# Patient Record
Sex: Male | Born: 1980
Health system: Southern US, Community
[De-identification: ages and names within clinical notes are randomized; demographics above are authoritative.]

## PROBLEM LIST (undated history)

## (undated) DIAGNOSIS — E669 Obesity, unspecified: Secondary | ICD-10-CM

## (undated) DIAGNOSIS — Z7289 Other problems related to lifestyle: Secondary | ICD-10-CM

## (undated) DIAGNOSIS — R51 Headache: Secondary | ICD-10-CM

## (undated) DIAGNOSIS — Z8249 Family history of ischemic heart disease and other diseases of the circulatory system: Secondary | ICD-10-CM

## (undated) DIAGNOSIS — K219 Gastro-esophageal reflux disease without esophagitis: Secondary | ICD-10-CM

## (undated) DIAGNOSIS — Z789 Other specified health status: Secondary | ICD-10-CM

## (undated) HISTORY — DX: Headache: R51

## (undated) HISTORY — DX: Obesity, unspecified: E66.9

## (undated) HISTORY — DX: Other specified health status: Z78.9

## (undated) HISTORY — DX: Other problems related to lifestyle: Z72.89

## (undated) HISTORY — DX: Family history of ischemic heart disease and other diseases of the circulatory system: Z82.49

## (undated) HISTORY — DX: Gastro-esophageal reflux disease without esophagitis: K21.9

---
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
## 2015-01-29 DIAGNOSIS — K219 Gastro-esophageal reflux disease without esophagitis: Secondary | ICD-10-CM

## 2015-01-29 HISTORY — DX: Gastro-esophageal reflux disease without esophagitis: K21.9

## 2015-02-04 ENCOUNTER — Encounter: Payer: Self-pay | Admitting: Medical

## 2015-02-04 ENCOUNTER — Ambulatory Visit (INDEPENDENT_AMBULATORY_CARE_PROVIDER_SITE_OTHER): Payer: BLUE CROSS/BLUE SHIELD | Admitting: Medical

## 2015-02-04 VITALS — BP 122/90 | HR 90 | Resp 16 | Ht 70.0 in | Wt 221.6 lb

## 2015-02-04 DIAGNOSIS — Z8249 Family history of ischemic heart disease and other diseases of the circulatory system: Secondary | ICD-10-CM

## 2015-02-04 DIAGNOSIS — R1310 Dysphagia, unspecified: Secondary | ICD-10-CM

## 2015-02-04 DIAGNOSIS — R0602 Shortness of breath: Secondary | ICD-10-CM | POA: Diagnosis not present

## 2015-02-04 DIAGNOSIS — R079 Chest pain, unspecified: Secondary | ICD-10-CM | POA: Diagnosis not present

## 2015-02-04 MED ORDER — DEXLANSOPRAZOLE 60 MG PO CPDR: 60.0000 mg | DELAYED_RELEASE_CAPSULE | Freq: Every day | ORAL | Status: DC

## 2015-02-04 NOTE — Progress Notes (Signed)
Subjective: Chief Complaint  Patient presents with  . throat issues    feels like something is stuck usually only when eating, but will also happen just when swollowing saliva. been going on a couple of months but getting worse. he says he feels it in his throat down to his chest and lungs. "burning sensation" also gets dizzy and sometimes headaches  . also    been having pains around the breast area on both sides, more so left around heart. can feel tingling in hands and left foot.   Here as a new patient.  He reports problems with swallowing. First noted problems 3 months ago.   Was eating, felt like food went in the wrong tube.  This started becoming more frequent.   Feels like something is stuck in his throat.  When drinking soda, gets immediate hiccups sometimes, sometimes discomfort in upper chest/esophagus.   Water is fine.   He notes with mashed potatoes, felt like he had mashed potatoes in his lungs.   Certainly has a lot of problems with solids and meats.  He has not had immediate vomiting with any food.   Lately has had some problems to the point of regurgitating or vomiting up food.  Gets a burning sensation in upper chest.  At times vision seems blurry, will get dizzy.  Sometimes gets shortness of breath.    He does note some recently chest pains maybe once weekly the last few weeks.  Chest pain can last minutes.   Sometimes mid chest sometimes left lower chest.  Pain in chest is sharp, denies associated nausea, sweating, vomiting, but no jaw or arm pain.  Sometimes gets tingling in left hand or left foot.  Sometimes feels SOB.  He is a nonsmoker.  Drinks 3-4 beers daily, typically coors light.  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    Does eat some acidic and spicy foods.   Uses no particular diet discretion.    Mom had MI at age 34 yo.   Dad had stroke age in his early 73s.  He notes no routine health care in 13 years.    ROS as in subjective   Objective: BP 122/90 mmHg   Pulse 90  Resp 16  Ht 5\' 10"  (1.778 m)  Wt 221 lb 9.6 oz (100.517 kg)  BMI 31.80 kg/m2  General appearance: alert, no distress, WD/WN, obese white male HEENT: normocephalic, sclerae anicteric, TMs pearly, nares patent, no discharge or erythema, pharynx normal Oral cavity: MMM, no lesions Neck: supple, no lymphadenopathy, no thyromegaly, no masses Heart: RRR, normal S1, S2, no murmurs Lungs: CTA bilaterally, no wheezes, rhonchi, or rales Abdomen: +bs, soft, non tender, non distended, no masses, no hepatomegaly, no splenomegaly Pulses: 2+ symmetric, upper and lower extremities, normal cap refill Ext: no edema Neuro: CN2-12 intact, nonfocal exam   Adult ECG Report  Indication: chest pain  Rate: 87 bpm  Rhythm: normal sinus rhythm  QRS Axis: 44 degrees  PR Interval: 169ms  QRS Duration: 161ms  QTc: 434ms  Conduction Disturbances: none  Other Abnormalities: none  Patient's cardiac risk factors are: family history of premature cardiovascular disease and obesity (BMI >= 30 kg/m2).  EKG comparison: none  Narrative Interpretation: normal EKG    Assessment: Encounter Diagnoses  Name Primary?  . Trouble swallowing Yes  . Chest pain, unspecified chest pain type   . SOB (shortness of breath)   . Family history of premature CAD      Plan: Discussed his swallowing  symptoms that may or may not be related to his chest pain and SOB.   discussed differential.   EKG done today.  Discussed his concerns.  He is already using slower eating and using smaller pieces of food.  Advised he cut significantly down on alcohol since he is currently drinking 3-4 beers daily.  Discussed avoidance of GERD trigger foods.   Begin trial of Dexilant daily, samples given.  Recheck in 2-3 weeks for a physical.   Discussed possible GI referral if not resolving.    Discussed premature CAD, risks factors for heart disease.   Advised he return in 2-3 weeks for fasting labs, physical.

## 2015-02-06 ENCOUNTER — Ambulatory Visit: Payer: Self-pay | Admitting: Medical

## 2015-02-25 ENCOUNTER — Encounter: Payer: Self-pay | Admitting: Medical

## 2015-02-25 ENCOUNTER — Ambulatory Visit
Admission: RE | Admit: 2015-02-25 | Discharge: 2015-02-25 | Disposition: A | Discharge status: Home / Self Care | Payer: BLUE CROSS/BLUE SHIELD | Source: Ambulatory Visit | Attending: Medical | Admitting: Medical

## 2015-02-25 ENCOUNTER — Ambulatory Visit (INDEPENDENT_AMBULATORY_CARE_PROVIDER_SITE_OTHER): Payer: BLUE CROSS/BLUE SHIELD | Admitting: Medical

## 2015-02-25 VITALS — BP 130/92 | HR 94 | Ht 71.0 in | Wt 222.0 lb

## 2015-02-25 DIAGNOSIS — K2100 Gastro-esophageal reflux disease with esophagitis, without bleeding: Secondary | ICD-10-CM | POA: Insufficient documentation

## 2015-02-25 DIAGNOSIS — R1310 Dysphagia, unspecified: Secondary | ICD-10-CM

## 2015-02-25 DIAGNOSIS — R079 Chest pain, unspecified: Secondary | ICD-10-CM

## 2015-02-25 DIAGNOSIS — Z0000 Encounter for general adult medical examination without abnormal findings: Secondary | ICD-10-CM | POA: Insufficient documentation

## 2015-02-25 DIAGNOSIS — Z87891 Personal history of nicotine dependence: Secondary | ICD-10-CM | POA: Diagnosis not present

## 2015-02-25 DIAGNOSIS — E669 Obesity, unspecified: Secondary | ICD-10-CM

## 2015-02-25 DIAGNOSIS — Z23 Encounter for immunization: Secondary | ICD-10-CM

## 2015-02-25 LAB — LIPID PANEL
CHOL/HDL RATIO: 3.6 ratio (ref <=5.0)
Cholesterol: 209 mg/dL — ABNORMAL HIGH (ref 125–200)
HDL: 58 mg/dL (ref >=40)
LDL Cholesterol: 124 mg/dL (ref <130)
Triglycerides: 135 mg/dL (ref <150)
VLDL: 27 mg/dL (ref <30)

## 2015-02-25 LAB — CBC
HCT: 45.2 % (ref 39.0–52.0)
Hemoglobin: 15.5 g/dL (ref 13.0–17.0)
MCH: 29.6 pg (ref 26.0–34.0)
MCHC: 34.3 g/dL (ref 30.0–36.0)
MCV: 86.4 fL (ref 78.0–100.0)
MPV: 9.8 fL (ref 8.6–12.4)
Platelets: 244 10*3/uL (ref 150–400)
RBC: 5.23 MIL/uL (ref 4.22–5.81)
RDW: 14.5 % (ref 11.5–15.5)
WBC: 8.4 10*3/uL (ref 4.0–10.5)

## 2015-02-25 LAB — COMPREHENSIVE METABOLIC PANEL
ALT: 67 U/L — AB (ref 9–46)
AST: 41 U/L — ABNORMAL HIGH (ref 10–40)
Albumin: 4.5 g/dL (ref 3.6–5.1)
Alkaline Phosphatase: 81 U/L (ref 40–115)
BUN: 12 mg/dL (ref 7–25)
CHLORIDE: 101 mmol/L (ref 98–110)
CO2: 26 mmol/L (ref 20–31)
CREATININE: 0.87 mg/dL (ref 0.60–1.35)
Calcium: 9.5 mg/dL (ref 8.6–10.3)
GLUCOSE: 89 mg/dL (ref 65–99)
POTASSIUM: 4.5 mmol/L (ref 3.5–5.3)
SODIUM: 139 mmol/L (ref 135–146)
TOTAL PROTEIN: 7.5 g/dL (ref 6.1–8.1)
Total Bilirubin: 0.6 mg/dL (ref 0.2–1.2)

## 2015-02-25 LAB — TSH: TSH: 1.276 u[IU]/mL (ref 0.350–4.500)

## 2015-02-25 NOTE — Progress Notes (Signed)
Subjective:   HPI  Justin Phillips is a 34 y.o. male who presents for a complete physical.  Concerns: At last visit we had him make GERD diet changes, cut back on alcohol and begin Dexilant.  He is over 90% improved from last visit, saw immediate improvements.   Given his recent chest pain and smoking history, wants CXR.  There is family hx/o lung cancer.   Reviewed their medical, surgical, family, social, medication, and allergy history and updated chart as appropriate.  Past Medical History  Diagnosis Date  . Obesity   . GERD (gastroesophageal reflux disease) 01/2015    Past Surgical History  Procedure Laterality Date  . Ankle arthroscopy with arthrodesis  1997    left; age 23    Social History   Social History  . Marital Status: Married    Spouse Name: N/A  . Number of Children: N/A  . Years of Education: N/A   Occupational History  . Not on file.   Social History Main Topics  . Smoking status: Former Smoker -- 1.00 packs/day for 13 years    Quit date: 02/25/2008  . Smokeless tobacco: Not on file  . Alcohol Use: 16.8 oz/week    28 Cans of beer, 0 Standard drinks or equivalent per week  . Drug Use: No  . Sexual Activity: Not on file   Other Topics Concern  . 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  Exercise - not much.   As of 01/2015    Family History  Problem Relation Age of Onset  . Heart disease Mother 43    MI  . Other Mother     perforated colon  . Asthma Mother   . Stroke Father 30  . Other Father     pain medication addition  . Diabetes Sister     borderline  . Hyperthyroidism Sister   . Asthma Sister   . Cancer Maternal Grandmother     lung  . Asthma Maternal Grandfather   . Cancer Paternal Grandfather     lung     Current outpatient prescriptions:  .  dexlansoprazole (DEXILANT) 60 MG capsule, Take 1 capsule (60 mg total) by mouth daily. (Patient not taking:  Reported on 02/25/2015), Disp: 15 capsule, Rfl: 0  Allergies  Allergen Reactions  . Advil [Ibuprofen]     Hives   . Nsaids Hives      Review of Systems Constitutional: -fever, -chills, -sweats, -unexpected weight change, -decreased appetite, -fatigue Allergy: -sneezing, -itching, -congestion Dermatology: -changing moles, --rash, -lumps ENT: -runny nose, -ear pain, -sore throat, -hoarseness, -sinus pain, -teeth pain, - ringing in ears, -hearing loss, -nosebleeds Cardiology: -chest pain, -palpitations, -swelling, -difficulty breathing when lying flat, -waking up short of breath Respiratory: -cough, -shortness of breath, -difficulty breathing with exercise or exertion, -wheezing, -coughing up blood Gastroenterology: -abdominal pain, -nausea, -vomiting, -diarrhea, -constipation, -blood in stool, -changes in bowel movement, -difficulty swallowing or eating Hematology: -bleeding, -bruising  Musculoskeletal: -joint aches, -muscle aches, -joint swelling, -back pain, -neck pain, -cramping, -changes in gait Ophthalmology: denies vision changes, eye redness, itching, discharge Urology: -burning with urination, -difficulty urinating, -blood in urine, -urinary frequency, -urgency, -incontinence Neurology: -headache, -weakness, -tingling, -numbness, -memory loss, -falls, -dizziness Psychology: -depressed mood, -agitation, -sleep problems     Objective:   Physical Exam  BP 130/92 mmHg  Pulse 94  Ht 5\' 11"  (1.803 m)  Wt 222 lb (100.699 kg)  BMI 30.98 kg/m2  General appearance: alert, no distress, WD/WN, white male Skin: right hip with 1cm diameter pink lesion nodular texture 1cm diameter, scattered freckles, no other worrisome lesion, right scalp within hair line with 85mm mobile cystic lesion HEENT: normocephalic, conjunctiva/corneas normal, sclerae anicteric, PERRLA, EOMi, nares patent, no discharge or erythema, pharynx normal Oral cavity: MMM, tongue normal, teeth in good repair, there is a  pink 23mm x 27mm raised fleshy growth in right posterior buccal mucosa unchanged for years per patient Neck: supple, no lymphadenopathy, no thyromegaly, no masses, normal ROM, no bruits Chest: non tender, normal shape and expansion Heart: RRR, normal S1, S2, no murmurs Lungs: CTA bilaterally, no wheezes, rhonchi, or rales Abdomen: +bs, soft, mild RLQ tenderness, otherwise non tender, non distended, no masses, no hepatomegaly, no splenomegaly, no bruits Back: non tender, normal ROM, no scoliosis Musculoskeletal: upper extremities non tender, no obvious deformity, normal ROM throughout, lower extremities non tender, no obvious deformity, normal ROM throughout Extremities: no edema, no cyanosis, no clubbing Pulses: 2+ symmetric, upper and lower extremities, normal cap refill Neurological: alert, oriented x 3, CN2-12 intact, strength normal upper extremities and lower extremities, sensation normal throughout, DTRs 2+ throughout, no cerebellar signs, gait normal Psychiatric: normal affect, behavior normal, pleasant  GU: normal male external genitalia, circumcised, nontender, no masses, no hernia, no lymphadenopathy Rectal: deferred   Assessment and Plan :     Encounter Diagnoses  Name Primary?  . Encounter for health maintenance examination in adult Yes  . Gastroesophageal reflux disease with esophagitis   . Trouble swallowing   . Obesity   . Need for Tdap vaccination   . Chest pain, unspecified chest pain type   . Former smoker     Physical exam - discussed healthy lifestyle, diet, exercise, preventative care, vaccinations, and addressed their concerns.   See your eye doctor yearly for routine vision care. See your dentist yearly for routine dental care including hygiene visits twice yearly. Discussed monthly testicle exams Routine labs today Counseled on the Tdap (tetanus, diptheria, and acellular pertussis) vaccine.  Vaccine information sheet given. Tdap vaccine given after consent  obtained. Go for CXR GERD - c/t GERD trigger avoidance, limit alcohol, use Dexilant samples another 2-3 weeks, then stop Dexilant.    Then will use watch and wait approach Obesity - advised lifestyles changes, exercise, weight loss.  Skin lesions - will c/t monitor right hip lesion, likely benign granuloma Follow-up pending labs, CXR

## 2015-02-26 LAB — HEMOGLOBIN A1C
Hgb A1c MFr Bld: 5.6 % (ref <5.7)
Mean Plasma Glucose: 114 mg/dL (ref <117)

## 2015-02-27 ENCOUNTER — Other Ambulatory Visit: Payer: Self-pay | Admitting: Medical

## 2015-02-27 MED ORDER — AZITHROMYCIN 250 MG PO TABS: ORAL_TABLET | ORAL | Status: DC

## 2015-03-03 ENCOUNTER — Telehealth: Payer: Self-pay

## 2015-03-03 ENCOUNTER — Other Ambulatory Visit: Payer: Self-pay | Admitting: Medical

## 2015-03-03 DIAGNOSIS — R9389 Abnormal findings on diagnostic imaging of other specified body structures: Secondary | ICD-10-CM

## 2015-03-03 NOTE — Telephone Encounter (Signed)
Pt called back and he says there hasn't really been any change. Seems to be coughing up a bit more phlem than he was before, other than that everything is still the same. He also wants to double check and see if he is supposed to get another xray done.

## 2015-03-03 NOTE — Telephone Encounter (Signed)
If not much improved in the next few days, we may want to consider different medication (assuming he is not worse).    If getting better, then yes we plan to repeat chest xray in 2-3 weeks.

## 2015-03-03 NOTE — Telephone Encounter (Signed)
Pt is aware. I checked his chart the orders for his xray are not in. Pt said he would call back if getting worse or if not better in a few days

## 2015-03-17 ENCOUNTER — Ambulatory Visit
Admission: RE | Admit: 2015-03-17 | Discharge: 2015-03-17 | Disposition: A | Discharge status: Home / Self Care | Payer: BLUE CROSS/BLUE SHIELD | Source: Ambulatory Visit | Attending: Medical | Admitting: Medical

## 2015-03-17 DIAGNOSIS — R9389 Abnormal findings on diagnostic imaging of other specified body structures: Secondary | ICD-10-CM

## 2015-05-11 ENCOUNTER — Other Ambulatory Visit: Payer: Self-pay | Admitting: Physician Assistant

## 2015-05-11 DIAGNOSIS — M545 Low back pain: Secondary | ICD-10-CM

## 2015-05-20 ENCOUNTER — Ambulatory Visit
Admission: RE | Admit: 2015-05-20 | Discharge: 2015-05-20 | Disposition: A | Discharge status: Home / Self Care | Payer: Worker's Compensation | Source: Ambulatory Visit | Attending: Physician Assistant | Admitting: Physician Assistant

## 2015-05-20 DIAGNOSIS — M545 Low back pain: Secondary | ICD-10-CM

## 2015-07-01 ENCOUNTER — Encounter: Payer: Self-pay | Admitting: Medical

## 2015-07-01 ENCOUNTER — Ambulatory Visit (INDEPENDENT_AMBULATORY_CARE_PROVIDER_SITE_OTHER): Payer: BLUE CROSS/BLUE SHIELD | Admitting: Medical

## 2015-07-01 VITALS — BP 120/90 | HR 75 | Temp 98.4°F | Resp 18 | Wt 207.0 lb

## 2015-07-01 DIAGNOSIS — R05 Cough: Secondary | ICD-10-CM | POA: Diagnosis not present

## 2015-07-01 DIAGNOSIS — R358 Other polyuria: Secondary | ICD-10-CM

## 2015-07-01 DIAGNOSIS — R748 Abnormal levels of other serum enzymes: Secondary | ICD-10-CM

## 2015-07-01 DIAGNOSIS — H65192 Other acute nonsuppurative otitis media, left ear: Secondary | ICD-10-CM

## 2015-07-01 DIAGNOSIS — R059 Cough, unspecified: Secondary | ICD-10-CM

## 2015-07-01 DIAGNOSIS — R351 Nocturia: Secondary | ICD-10-CM

## 2015-07-01 DIAGNOSIS — R3589 Other polyuria: Secondary | ICD-10-CM | POA: Insufficient documentation

## 2015-07-01 DIAGNOSIS — H9202 Otalgia, left ear: Secondary | ICD-10-CM | POA: Diagnosis not present

## 2015-07-01 DIAGNOSIS — H6123 Impacted cerumen, bilateral: Secondary | ICD-10-CM | POA: Diagnosis not present

## 2015-07-01 DIAGNOSIS — H9209 Otalgia, unspecified ear: Secondary | ICD-10-CM | POA: Insufficient documentation

## 2015-07-01 LAB — POCT URINALYSIS DIPSTICK
Bilirubin, UA: NEGATIVE
Glucose, UA: NEGATIVE
LEUKOCYTES UA: NEGATIVE
Nitrite, UA: NEGATIVE
Spec Grav, UA: 1.025
UROBILINOGEN UA: NEGATIVE
pH, UA: 6

## 2015-07-01 MED ORDER — AMOXICILLIN 875 MG PO TABS: 875.0000 mg | ORAL_TABLET | Freq: Two times a day (BID) | ORAL | Status: DC

## 2015-07-01 MED ORDER — BENZONATATE 200 MG PO CAPS: 200.0000 mg | ORAL_CAPSULE | Freq: Three times a day (TID) | ORAL | Status: DC | PRN

## 2015-07-01 MED ORDER — CETIRIZINE HCL 10 MG PO TABS: 10.0000 mg | ORAL_TABLET | Freq: Every day | ORAL | Status: DC

## 2015-07-01 NOTE — Progress Notes (Addendum)
Subjective: Chief Complaint  Patient presents with  . Cough    kept him up all night. drainage in his nose. phlegm is white but is turning yellow. coughed so much he threw up. has been urinating more frequently, not sure if it could be relaated to epidural shot 2 days ago.    Here for cough, just started last night and all day yesterday.   Some productive white sputum.   Vomiting from coughing so much.  Has some sore throat, some ear discomfort left ear.   Some sinus pressure.   Denies fever, no nausea, no diarrhea.   Has had some wheezing.  Using sinus medication OTC.   Wife had sniffles last week, but wasn't sick.  Does have some sneezing, itchy eyes.   No other aggravating or relieving factors.   In general been urinating more than usual.   Has been drinking more water though.   Urinating 15 times daily he thinks and getting up 5 times per night to urinate.   Had recent EDSI for spinal pain, sees ortho for DDD in lumbar spine.  No burning with urination, but slight discomfort at times at the tip of the penis.  No pain or swelling in genitals.   Has had some increased thirst.   No hx/o UTI or STD.   Married.  occasionally has dribbling.  Has been having some nocturia in general for a while though, typically 1-2 times per night to urinate.  No concern for STD.  No prior prostate exam.      Past Medical History  Diagnosis Date  . Obesity   . GERD (gastroesophageal reflux disease) 01/2015   Past Surgical History  Procedure Laterality Date  . Ankle arthroscopy with arthrodesis  1997    left; age 5   ROS as in subjective  Objective: BP 120/90 mmHg  Pulse 75  Temp(Src) 98.4 F (36.9 C) (Tympanic)  Resp 18  Wt 207 lb (93.895 kg)  General appearance: alert, no distress, WD/WN HEENT: normocephalic, sclerae anicteric, TMs not visualized given impacted cerumen, nares with swollen turbinates, clear discharge, mild erythema, pharynx normal Oral cavity: MMM, no lesions Neck: supple, no  lymphadenopathy, no thyromegaly, no masses Heart: RRR, normal S1, S2, no murmurs Lungs: CTA bilaterally, no wheezes, rhonchi, or rales Abdomen: +bs, soft, non tender, non distended, no masses, no hepatomegaly, no splenomegaly Pulses: 2+ symmetric, upper and lower extremities, normal cap refill DRE - anus normal tone, prostate seems mildly enlarged, no nodules   Assessment: Encounter Diagnoses  Name Primary?  . Cough Yes  . Otalgia, left   . Impacted cerumen of both ears   . Polyuria   . Nocturia   . Elevated liver enzymes   . Acute nonsuppurative otitis media of left ear     Plan: Cough, likely related to allergies, but he also has OM on exam and likely some sinus drainage.  begin [employer], Cetirizine, call if not seeing improvement within 4-5 days  Otitis media, otalgia - after removing the impacted cerumen, he seems to have serous effusion and OM.   Begin amoxicillin.  Discussed findings.  Discussed risk/benefits of procedure and patient agrees to procedure. Successfully used warm water lavage and currettage to remove impacted cerumen from bilat ear canal. Patient tolerated procedure well. Advised they avoid using any cotton swabs or other devices to clean the ear canals.  Use basic hygiene as discussed.  Follow up prn.   Polyuria, nocturia - urine culture sent, PSA lab today  Elevated LFTs - [place] was seen today for cough.  Diagnoses and all orders for this visit:  Cough  Otalgia, left  Impacted cerumen of both ears  Polyuria -     Urine culture -     PSA -     POCT urinalysis dipstick  Nocturia -     Urine culture -     PSA -     POCT urinalysis dipstick  Elevated liver enzymes -     Hepatic function panel  Acute nonsuppurative otitis media of left ear  Other orders -     benzonatate (TESSALON) 200 MG capsule; Take 1 capsule (200 mg total) by mouth 3 (three) times daily as needed for cough. -     cetirizine (ZYRTEC) 10 MG  tablet; Take 1 tablet (10 mg total) by mouth at bedtime. -     amoxicillin (AMOXIL) 875 MG tablet; Take 1 tablet (875 mg total) by mouth 2 (two) times daily.

## 2015-07-02 LAB — HEPATIC FUNCTION PANEL
ALBUMIN: 4.7 g/dL (ref 3.6–5.1)
ALT: 54 U/L — AB (ref 9–46)
AST: 27 U/L (ref 10–40)
Alkaline Phosphatase: 95 U/L (ref 40–115)
BILIRUBIN INDIRECT: 0.5 mg/dL (ref 0.2–1.2)
Bilirubin, Direct: 0.1 mg/dL (ref <=0.2)
TOTAL PROTEIN: 7.6 g/dL (ref 6.1–8.1)
Total Bilirubin: 0.6 mg/dL (ref 0.2–1.2)

## 2015-07-02 LAB — PSA: PSA: 0.72 ng/mL (ref <=4.00)

## 2015-07-03 LAB — URINE CULTURE
Colony Count: NO GROWTH
ORGANISM ID, BACTERIA: NO GROWTH

## 2016-03-31 DIAGNOSIS — G8929 Other chronic pain: Secondary | ICD-10-CM

## 2016-03-31 DIAGNOSIS — R519 Headache, unspecified: Secondary | ICD-10-CM

## 2016-03-31 HISTORY — DX: Other chronic pain: G89.29

## 2016-03-31 HISTORY — DX: Headache, unspecified: R51.9

## 2016-04-18 ENCOUNTER — Ambulatory Visit (INDEPENDENT_AMBULATORY_CARE_PROVIDER_SITE_OTHER): Payer: BLUE CROSS/BLUE SHIELD | Admitting: Medical

## 2016-04-18 ENCOUNTER — Encounter: Payer: Self-pay | Admitting: Medical

## 2016-04-18 VITALS — BP 120/90 | HR 80 | Temp 98.0°F | Wt 217.6 lb

## 2016-04-18 DIAGNOSIS — I159 Secondary hypertension, unspecified: Secondary | ICD-10-CM | POA: Diagnosis not present

## 2016-04-18 DIAGNOSIS — H5711 Ocular pain, right eye: Secondary | ICD-10-CM | POA: Diagnosis not present

## 2016-04-18 DIAGNOSIS — H43391 Other vitreous opacities, right eye: Secondary | ICD-10-CM

## 2016-04-18 NOTE — Progress Notes (Signed)
Subjective: Chief Complaint  Patient presents with  . Headache    pain behind rt eye 9 days and spots in front of eye. Sensitive   Here for headache behind right eye x 9 days.  coughing makes it worse and see sports.  Had flu few weeks ago.     Pain is behind eye, if looking up hurts worse.   Before this started, was seeing floaters in his vision, circular floaters.  No other headache.   No ear pain, no sore throat, no post nasal drainage, but has had some cough.   No NVD.  No numbness, no tingling, no weakness.   No slurred speech.   No vision loss.  Has more light sensitivity.    Never seen eye doctor.    Great grandmother went blind, his father has some eye issues, but related to diabetes.   No other aggravating or relieving factors. No other complaint.   Past Medical History:  Diagnosis Date  . GERD (gastroesophageal reflux disease) 01/2015  . Obesity    Current Outpatient Prescriptions on File Prior to Visit  Medication Sig Dispense Refill  . amoxicillin (AMOXIL) 875 MG tablet Take 1 tablet (875 mg total) by mouth 2 (two) times daily. (Patient not taking: Reported on 04/18/2016) 20 tablet 0  . benzonatate (TESSALON) 200 MG capsule Take 1 capsule (200 mg total) by mouth 3 (three) times daily as needed for cough. (Patient not taking: Reported on 04/18/2016) 30 capsule 0  . cetirizine (ZYRTEC) 10 MG tablet Take 1 tablet (10 mg total) by mouth at bedtime. (Patient not taking: Reported on 04/18/2016) 30 tablet 2   No current facility-administered medications on file prior to visit.    ROS as in subjective    Objective: BP 120/90   Pulse 80   Temp 98 F (36.7 C) (Oral)   Wt 217 lb 9.6 oz (98.7 kg)   BMI 30.35 kg/m   Wt Readings from Last 3 Encounters:  04/18/16 217 lb 9.6 oz (98.7 kg)  07/01/15 207 lb (93.9 kg)  02/25/15 222 lb (100.7 kg)   BP Readings from Last 3 Encounters:  04/18/16 120/90  07/01/15 120/90  02/25/15 (!) 130/92    General appearance: alert, no  distress, WD/WN,  HEENT: normocephalic, sclerae anicteric, PERRLA, EOMi, nares patent, no discharge or erythema, pharynx normal Hand held Snellen 20/20 each eye Oral cavity: MMM, no lesions Neck: supple, no lymphadenopathy, no thyromegaly, no masses, no bruits Heart: RRR, normal S1, S2, no murmurs Lungs: CTA bilaterally, no wheezes, rhonchi, or rales Extremities: no edema, no cyanosis, no clubbing Pulses: 2+ symmetric, upper and lower extremities, normal cap refill Neurological: alert, oriented x 3, CN2-12 intact, strength normal upper extremities and lower extremities, sensation normal throughout, DTRs 2+ throughout, no cerebellar signs, gait normal Psychiatric: normal affect, behavior normal, pleasant     Assessment: Encounter Diagnoses  Name Primary?  . Eye pain, right Yes  . Floaters in visual field, right   . Secondary hypertension      Plan Eye pain , floaters - discussed possible causes of his headache/eye pain .   Advised he establish and get appt with eye doctor this week.  If unable to get appt scheduled by tomorrow, call back.  HTN - new onset.  Discussed diagnosis, possible complications, treatment recommendations.   discussed diet, exercise, and he will check BPs outside of the office over the next 2wk.  If BPs running elevated, then we will begin medication.  F/u within 2wk with  BP readings.    Bron was seen today for headache.  Diagnoses and all orders for this visit:  Eye pain, right  Floaters in visual field, right  Secondary hypertension

## 2016-04-18 NOTE — Patient Instructions (Signed)
Eye doctors: Dr. Webb Laws [place], [place], [place] [address] [phone]   [facility] Dr. Camillo Flaming [address] [place], [place] [address]  [email or web address]   Fabio Pierce, M.D. Corena Herter, O.D. [place], [place], [place] [address] Medical telephone: [phone] Optical telephone: [phone]    Start checking blood pressure.  Normal is 120/70.  If your readings are >140 on top, or >90 on bottom, then this is high blood pressure.    Hypertension Hypertension, commonly called high blood pressure, is when the force of blood pumping through your arteries is too strong. Your arteries are the blood vessels that carry blood from your heart throughout your body. A blood pressure reading consists of a higher number over a lower number, such as 110/72. The higher number (systolic) is the pressure inside your arteries when your heart pumps. The lower number (diastolic) is the pressure inside your arteries when your heart relaxes. Ideally you want your blood pressure below 120/80. Hypertension forces your heart to work harder to pump blood. Your arteries may become narrow or stiff. Having untreated or uncontrolled hypertension can cause heart attack, stroke, kidney disease, and other problems. What increases the risk? Some risk factors for high blood pressure are controllable. Others are not. Risk factors you cannot control include:  Race. You may be at higher risk if you are African American.  Age. Risk increases with age.  Gender. Men are at higher risk than women before age 54 years. After age 35, women are at higher risk than men. Risk factors you can control include:  Not getting enough exercise or physical activity.  Being overweight.  Getting too much fat, sugar, calories, or salt in your diet.  Drinking too much alcohol. What are the signs or symptoms? Hypertension does not usually cause  signs or symptoms. Extremely high blood pressure (hypertensive crisis) may cause headache, anxiety, shortness of breath, and nosebleed. How is this diagnosed? To check if you have hypertension, your health care provider will measure your blood pressure while you are seated, with your arm held at the level of your heart. It should be measured at least twice using the same arm. Certain conditions can cause a difference in blood pressure between your right and left arms. A blood pressure reading that is higher than normal on one occasion does not mean that you need treatment. If it is not clear whether you have high blood pressure, you may be asked to return on a different day to have your blood pressure checked again. Or, you may be asked to monitor your blood pressure at home for 1 or more weeks. How is this treated? Treating high blood pressure includes making lifestyle changes and possibly taking medicine. Living a healthy lifestyle can help lower high blood pressure. You may need to change some of your habits. Lifestyle changes may include:  Following the DASH diet. This diet is high in fruits, vegetables, and whole grains. It is low in salt, red meat, and added sugars.  Keep your sodium intake below 2,300 mg per day.  Getting at least 30-45 minutes of aerobic exercise at least 4 times per week.  Losing weight if necessary.  Not smoking.  Limiting alcoholic beverages.  Learning ways to reduce stress. Your health care provider may prescribe medicine if lifestyle changes are not enough to get your blood pressure under control, and if one of the following is true:  You are  82-43 years of age and your systolic blood pressure is above 140.  You are 92 years of age or older, and your systolic blood pressure is above 150.  Your diastolic blood pressure is above 90.  You have diabetes, and your systolic blood pressure is over [id number] or your diastolic blood pressure is over 90.  You have kidney  disease and your blood pressure is above 140/90.  You have heart disease and your blood pressure is above 140/90. Your personal target blood pressure may vary depending on your medical conditions, your age, and other factors. Follow these instructions at home:  Have your blood pressure rechecked as directed by your health care provider.  Take medicines only as directed by your health care provider. Follow the directions carefully. Blood pressure medicines must be taken as prescribed. The medicine does not work as well when you skip doses. Skipping doses also puts you at risk for problems.  Do not smoke.  Monitor your blood pressure at home as directed by your health care provider. Contact a health care provider if:  You think you are having a reaction to medicines taken.  You have recurrent headaches or feel dizzy.  You have swelling in your ankles.  You have trouble with your vision. Get help right away if:  You develop a severe headache or confusion.  You have unusual weakness, numbness, or feel faint.  You have severe chest or abdominal pain.  You vomit repeatedly.  You have trouble breathing. This information is not intended to replace advice given to you by your health care provider. Make sure you discuss any questions you have with your health care provider. Document Released: 02/14/2005 Document Revised: 07/23/2015 Document Reviewed: 12/07/2012 Elsevier Interactive Patient Education  2017 [employer].

## 2016-04-19 ENCOUNTER — Telehealth: Payer: Self-pay | Admitting: Medical

## 2016-04-19 NOTE — Telephone Encounter (Signed)
Pt called and states that he went to the eye dr yesterday and that his vision was fine and his pressure was fine, and that there was a grey ring around his eye that may  Could be related to his cholesterol, they took pictures of his eyes, he is going to get them to send over the information, pt can be reached at [phone]

## 2016-04-20 NOTE — Telephone Encounter (Signed)
We checked cholesterol 2 years ago.  We can certainly update this.  He can come in for fasting lipid panel.  Did the eye doctor have him use any particular treatment, did the think this was migraine related?  Other comments?

## 2016-04-22 NOTE — Telephone Encounter (Signed)
Called andl/m for pt to call us back. 

## 2016-04-22 NOTE — Telephone Encounter (Signed)
Spoke with patient , he said that headaches have gotten better , just have sinus pressure and will follow up next Monday per [facility]  If headaches  Gets worst . B/p are 120/80 .

## 2016-04-22 NOTE — Telephone Encounter (Signed)
Pt has an appt on marach 6 for cpe so will do lipid testing then, pt states the eye doctor didn't say anything about treatment

## 2016-04-22 NOTE — Telephone Encounter (Signed)
cholesterol in 2016 wasn't too bad.  Is he still having symptoms?  If so, this could represent an atypical or ocular migraine.   Find out in case I need to call out something.

## 2016-05-02 ENCOUNTER — Ambulatory Visit: Payer: BLUE CROSS/BLUE SHIELD | Admitting: Medical

## 2016-05-03 ENCOUNTER — Ambulatory Visit (INDEPENDENT_AMBULATORY_CARE_PROVIDER_SITE_OTHER): Payer: BLUE CROSS/BLUE SHIELD | Admitting: Medical

## 2016-05-03 ENCOUNTER — Encounter: Payer: Self-pay | Admitting: Medical

## 2016-05-03 VITALS — BP 124/80 | HR 67 | Ht 70.5 in | Wt 206.8 lb

## 2016-05-03 DIAGNOSIS — G4489 Other headache syndrome: Secondary | ICD-10-CM | POA: Insufficient documentation

## 2016-05-03 DIAGNOSIS — L989 Disorder of the skin and subcutaneous tissue, unspecified: Secondary | ICD-10-CM | POA: Insufficient documentation

## 2016-05-03 DIAGNOSIS — Z0000 Encounter for general adult medical examination without abnormal findings: Secondary | ICD-10-CM | POA: Diagnosis not present

## 2016-05-03 DIAGNOSIS — R945 Abnormal results of liver function studies: Secondary | ICD-10-CM

## 2016-05-03 DIAGNOSIS — R7989 Other specified abnormal findings of blood chemistry: Secondary | ICD-10-CM | POA: Insufficient documentation

## 2016-05-03 LAB — CBC
HCT: 44.9 % (ref 38.5–50.0)
Hemoglobin: 15.1 g/dL (ref 13.2–17.1)
MCH: 29.7 pg (ref 27.0–33.0)
MCHC: 33.6 g/dL (ref 32.0–36.0)
MCV: 88.4 fL (ref 80.0–100.0)
MPV: 10.1 fL (ref 7.5–12.5)
PLATELETS: 257 10*3/uL (ref 140–400)
RBC: 5.08 MIL/uL (ref 4.20–5.80)
RDW: 14.1 % (ref 11.0–15.0)
WBC: 9 10*3/uL (ref 4.0–10.5)

## 2016-05-03 LAB — COMPREHENSIVE METABOLIC PANEL
ALBUMIN: 4.6 g/dL (ref 3.6–5.1)
ALT: 27 U/L (ref 9–46)
AST: 23 U/L (ref 10–40)
Alkaline Phosphatase: 76 U/L (ref 40–115)
BILIRUBIN TOTAL: 0.6 mg/dL (ref 0.2–1.2)
BUN: 11 mg/dL (ref 7–25)
CO2: 27 mmol/L (ref 20–31)
CREATININE: 0.95 mg/dL (ref 0.60–1.35)
Calcium: 9.6 mg/dL (ref 8.6–10.3)
Chloride: 102 mmol/L (ref 98–110)
Glucose, Bld: 89 mg/dL (ref 65–99)
Potassium: 3.9 mmol/L (ref 3.5–5.3)
Sodium: 140 mmol/L (ref 135–146)
Total Protein: 7.3 g/dL (ref 6.1–8.1)

## 2016-05-03 LAB — LIPID PANEL
Cholesterol: 190 mg/dL (ref <200)
HDL: 58 mg/dL (ref >40)
LDL Cholesterol: 107 mg/dL — ABNORMAL HIGH (ref <100)
Total CHOL/HDL Ratio: 3.3 Ratio (ref <5.0)
Triglycerides: 126 mg/dL (ref <150)
VLDL: 25 mg/dL (ref <30)

## 2016-05-03 LAB — POCT URINALYSIS DIPSTICK
Bilirubin, UA: NEGATIVE
CLARITY UA: NEGATIVE
GLUCOSE UA: NEGATIVE
Ketones, UA: NEGATIVE
LEUKOCYTES UA: NEGATIVE
NITRITE UA: NEGATIVE
Protein, UA: NEGATIVE
RBC UA: POSITIVE
Spec Grav, UA: 1.02
UROBILINOGEN UA: NEGATIVE
pH, UA: 6

## 2016-05-03 NOTE — Progress Notes (Signed)
Subjective:   HPI  Justin Phillips is a 36 y.o. male who presents for a complete physical.  Concerns: I saw him recently for new onset headaches.  Still getting right sided headaches, intermittent, and no abnormality found at recent visit with eye doctor.    Declines flu shot.  Reviewed their medical, surgical, family, social, medication, and allergy history and updated chart as appropriate.  Past Medical History:  Diagnosis Date  . Alcohol use   . Chronic headaches 03/2016  . Family history of premature CAD    mother  . GERD (gastroesophageal reflux disease) 01/2015  . Obesity     Past Surgical History:  Procedure Laterality Date  . ANKLE ARTHROSCOPY WITH ARTHRODESIS  1997   left; age 37    Social History   Social History  . Marital status: Married    Spouse name: N/A  . Number of children: N/A  . Years of education: N/A   Occupational History  . Not on file.   Social History Main Topics  . Smoking status: Former Smoker    Packs/day: 1.00    Years: 13.00    Quit date: 02/25/2008  . Smokeless tobacco: Never Used  . Alcohol use 12.6 oz/week    21 Cans of beer per week  . Drug use: No  . Sexual activity: Not on file   Other Topics Concern  . 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  Exercise - some walking.   As of 04/2016    Family History  Problem Relation Age of Onset  . Heart disease Mother 42    MI  . Other Mother     perforated colon  . Asthma Mother   . Stroke Father 53  . Other Father     pain medication addition  . Hypertension Father   . Diabetes Sister     borderline  . Hyperthyroidism Sister   . Asthma Sister   . Cancer Maternal Grandmother     lung  . Asthma Maternal Grandfather   . Cancer Paternal Grandfather     lung     Current Outpatient Prescriptions:  .  acetaminophen (TYLENOL) 325 MG tablet, Take 650 mg by mouth every 6 (six) hours as needed.,  Disp: , Rfl:  .  Multiple Vitamin (MULTIVITAMIN) tablet, Take 1 tablet by mouth daily., Disp: , Rfl:  .  pseudoephedrine (SUDAFED) 120 MG 12 hr tablet, Take 120 mg by mouth 2 (two) times daily., Disp: , Rfl:   Allergies  Allergen Reactions  . Advil [Ibuprofen]     Hives   . Nsaids Hives    Review of Systems Constitutional: -fever, -chills, -sweats, -unexpected weight change, -decreased appetite, -fatigue Allergy: -sneezing, -itching, -congestion Dermatology: -changing moles, --rash, -lumps ENT: -runny nose, -ear pain, -sore throat, -hoarseness, -sinus pain, -teeth pain, - ringing in ears, -hearing loss, -nosebleeds Cardiology: -chest pain, -palpitations, -swelling, -difficulty breathing when lying flat, -waking up short of breath Respiratory: -cough, -shortness of breath, -difficulty breathing with exercise or exertion, -wheezing, -coughing up blood Gastroenterology: -abdominal pain, -nausea, -vomiting, -diarrhea, -constipation, -blood in stool, -changes in bowel movement, -difficulty swallowing or eating Hematology: -bleeding, -bruising  Musculoskeletal: -joint aches, -muscle aches, -joint swelling, -back pain, -neck pain, -cramping, -changes in gait Ophthalmology: denies vision changes, eye redness, itching, discharge Urology: -burning with urination, -difficulty urinating, -blood in urine, -urinary frequency, -urgency, -incontinence Neurology: -headache, -weakness, -tingling, -numbness, -memory loss, -falls, -  dizziness Psychology: -depressed mood, -agitation, -sleep problems     Objective:   Physical Exam  BP 124/80   Pulse 67   Ht 5' 10.5" (1.791 m)   Wt 206 lb 12.8 oz (93.8 kg)   SpO2 99%   BMI 29.25 kg/m   General appearance: alert, no distress, WD/WN, white male Skin: right hip with 1cm diameter pink lesion nodular texture 1cm diameter, left superior deltoid region with 77mm somewhat irregular, brown and pink lesions, otherwise scattered freckles, no other worrisome  lesion, right scalp within hair line with 40mm mobile cystic lesion HEENT: normocephalic, conjunctiva/corneas normal, sclerae anicteric, PERRLA, EOMi, nares patent, no discharge or erythema, pharynx normal Oral cavity: MMM, tongue normal, teeth in good repair, there is a pink 28mm x 1mm raised fleshy growth in right posterior buccal mucosa unchanged for years per patient Neck: supple, no lymphadenopathy, no thyromegaly, no masses, normal ROM, no bruits Chest: non tender, normal shape and expansion Heart: RRR, normal S1, S2, no murmurs Lungs: CTA bilaterally, no wheezes, rhonchi, or rales Abdomen: +bs, soft, mild RLQ tenderness, otherwise non tender, non distended, no masses, no hepatomegaly, no splenomegaly, no bruits Back: non tender, normal ROM, no scoliosis Musculoskeletal: upper extremities non tender, no obvious deformity, normal ROM throughout, lower extremities non tender, no obvious deformity, normal ROM throughout Extremities: no edema, no cyanosis, no clubbing Pulses: 2+ symmetric, upper and lower extremities, normal cap refill Neurological: alert, oriented x 3, CN2-12 intact, strength normal upper extremities and lower extremities, sensation normal throughout, DTRs 2+ throughout, no cerebellar signs, gait normal Psychiatric: normal affect, behavior normal, pleasant  GU: normal male external genitalia, circumcised, nontender, no masses, no hernia, no lymphadenopathy Rectal: deferred   Assessment and Plan :     Encounter Diagnoses  Name Primary?  . Routine general medical examination at a health care facility Yes  . Encounter for health maintenance examination in adult   . Elevated LFTs   . Headache syndrome   . Skin lesion     Physical exam - discussed healthy lifestyle, diet, exercise, preventative care, vaccinations, and addressed their concerns.   See your eye doctor yearly for routine vision care. See your dentist yearly for routine dental care including hygiene visits  twice yearly. Discussed monthly testicle exams Routine labs today  Elevated LFTs - advised cutting back on alcohol consumption.  Labs today, consider abdomen US   Skin lesions -right hip lesions examined by supervising physician Dr. Redmond School.  Reassured, likely sebaceous cyst.  Advised he return for biopsy of the left shoulder lesion  Headaches - pending labs, consider head imaging.  Likely headaches are stress related though.  Follow-up pending labs

## 2016-05-04 LAB — HEMOGLOBIN A1C
HEMOGLOBIN A1C: 5.3 % (ref <5.7)
Mean Plasma Glucose: 105 mg/dL

## 2016-05-04 LAB — TSH: TSH: 1.4 m[IU]/L (ref 0.40–4.50)

## 2016-05-06 ENCOUNTER — Other Ambulatory Visit (INDEPENDENT_AMBULATORY_CARE_PROVIDER_SITE_OTHER): Payer: BLUE CROSS/BLUE SHIELD

## 2016-05-06 DIAGNOSIS — Z87448 Personal history of other diseases of urinary system: Secondary | ICD-10-CM | POA: Diagnosis not present

## 2016-05-06 LAB — POCT URINALYSIS DIPSTICK
Bilirubin, UA: NEGATIVE
GLUCOSE UA: NEGATIVE
Ketones, UA: NEGATIVE
LEUKOCYTES UA: NEGATIVE
NITRITE UA: NEGATIVE
Protein, UA: NEGATIVE
Spec Grav, UA: >=1.03
UROBILINOGEN UA: NEGATIVE
pH, UA: 6

## 2016-05-07 LAB — URINALYSIS, MICROSCOPIC ONLY
Bacteria, UA: NONE SEEN [HPF]
CASTS: NONE SEEN [LPF]
Crystals: NONE SEEN [HPF]
Squamous Epithelial / LPF: NONE SEEN [HPF] (ref <=5)
WBC UA: NONE SEEN WBC/HPF (ref <=5)
Yeast: NONE SEEN [HPF]

## 2016-05-12 ENCOUNTER — Other Ambulatory Visit: Payer: Self-pay

## 2016-05-12 DIAGNOSIS — R319 Hematuria, unspecified: Secondary | ICD-10-CM

## 2016-05-17 ENCOUNTER — Other Ambulatory Visit: Payer: Self-pay | Admitting: Medical

## 2016-05-17 ENCOUNTER — Encounter: Payer: Self-pay | Admitting: Medical

## 2016-05-17 ENCOUNTER — Ambulatory Visit (INDEPENDENT_AMBULATORY_CARE_PROVIDER_SITE_OTHER): Payer: BLUE CROSS/BLUE SHIELD | Admitting: Medical

## 2016-05-17 VITALS — BP 128/80 | HR 77 | Wt 213.0 lb

## 2016-05-17 DIAGNOSIS — D229 Melanocytic nevi, unspecified: Secondary | ICD-10-CM

## 2016-05-17 DIAGNOSIS — L989 Disorder of the skin and subcutaneous tissue, unspecified: Secondary | ICD-10-CM

## 2016-05-17 NOTE — Progress Notes (Signed)
Subjective: Justin Phillips here for  Chief Complaint  Patient presents with  . shoton his shoulder    removal of spot on his shoulder    Here for skin lesion.   Lesion is located *left shoulder, been there for <1 year.  Recent changes include changes in color and size.     There is not history of skin cancer.    No other c/o.   Objective: BP 128/80   Pulse 77   Wt 213 lb (96.6 kg)   SpO2 97%   BMI 30.13 kg/m   Gen: wd, wn, nad Skin : left superior deltoid region with 46mm diameter brown and pink lesion, mostly flat    Assessment: Encounter Diagnoses  Name Primary?  . Atypical mole Yes  . Skin lesion       Plan: Discussed skin findings.   Patient desires excision.  Discussed risks and benefits of punch biopsy.  Cleaned and prepped left deltoid in usual sterile fashion, used 1% lidocaine and epinephrine for local anesthesia.   Used punch biopsy to remove the lesion.  Used single 5.0 nylon suture to close wound and achieve hemostasis. Cleaned the wound area. Covered with sterile bandage.   discussed wound care.  Pt tolerated procedure well.  <3 cc EBL.   specimen sent to pathology.  We will call with pathology results.

## 2016-05-24 ENCOUNTER — Ambulatory Visit (INDEPENDENT_AMBULATORY_CARE_PROVIDER_SITE_OTHER): Payer: BLUE CROSS/BLUE SHIELD | Admitting: Medical

## 2016-05-24 DIAGNOSIS — Z4802 Encounter for removal of sutures: Secondary | ICD-10-CM

## 2016-05-24 NOTE — Progress Notes (Signed)
Subjective:    Justin Phillips is a 36 y.o. male here for suture removal.  male was seen 7 days ago for biopsy left shoulder.  They deny pain, fever, redness, or drainage from the wound.   Review of Systems As in subjective   Objective:     gen: wd, wn, nad  left superior shoulder is healing well, without evidence of infection.    Assessment:   Encounter Diagnosis  Name Primary?  . Visit for suture removal Yes      Plan:     1. 1 suture was removed from left shoulder 2. Wound care discussed. 3. Follow up as needed.

## 2016-11-14 IMAGING — CR DG CHEST 2V
2 series · 2 of 2 positions shown · non-contrast
Comparison: Chest x-ray 07/07/2006.

CLINICAL DATA: 34-year-old male with chest pain and shortness of
breath for the past several days. Former smoker.

EXAM:
CHEST  2 VIEW

[w chest pa]
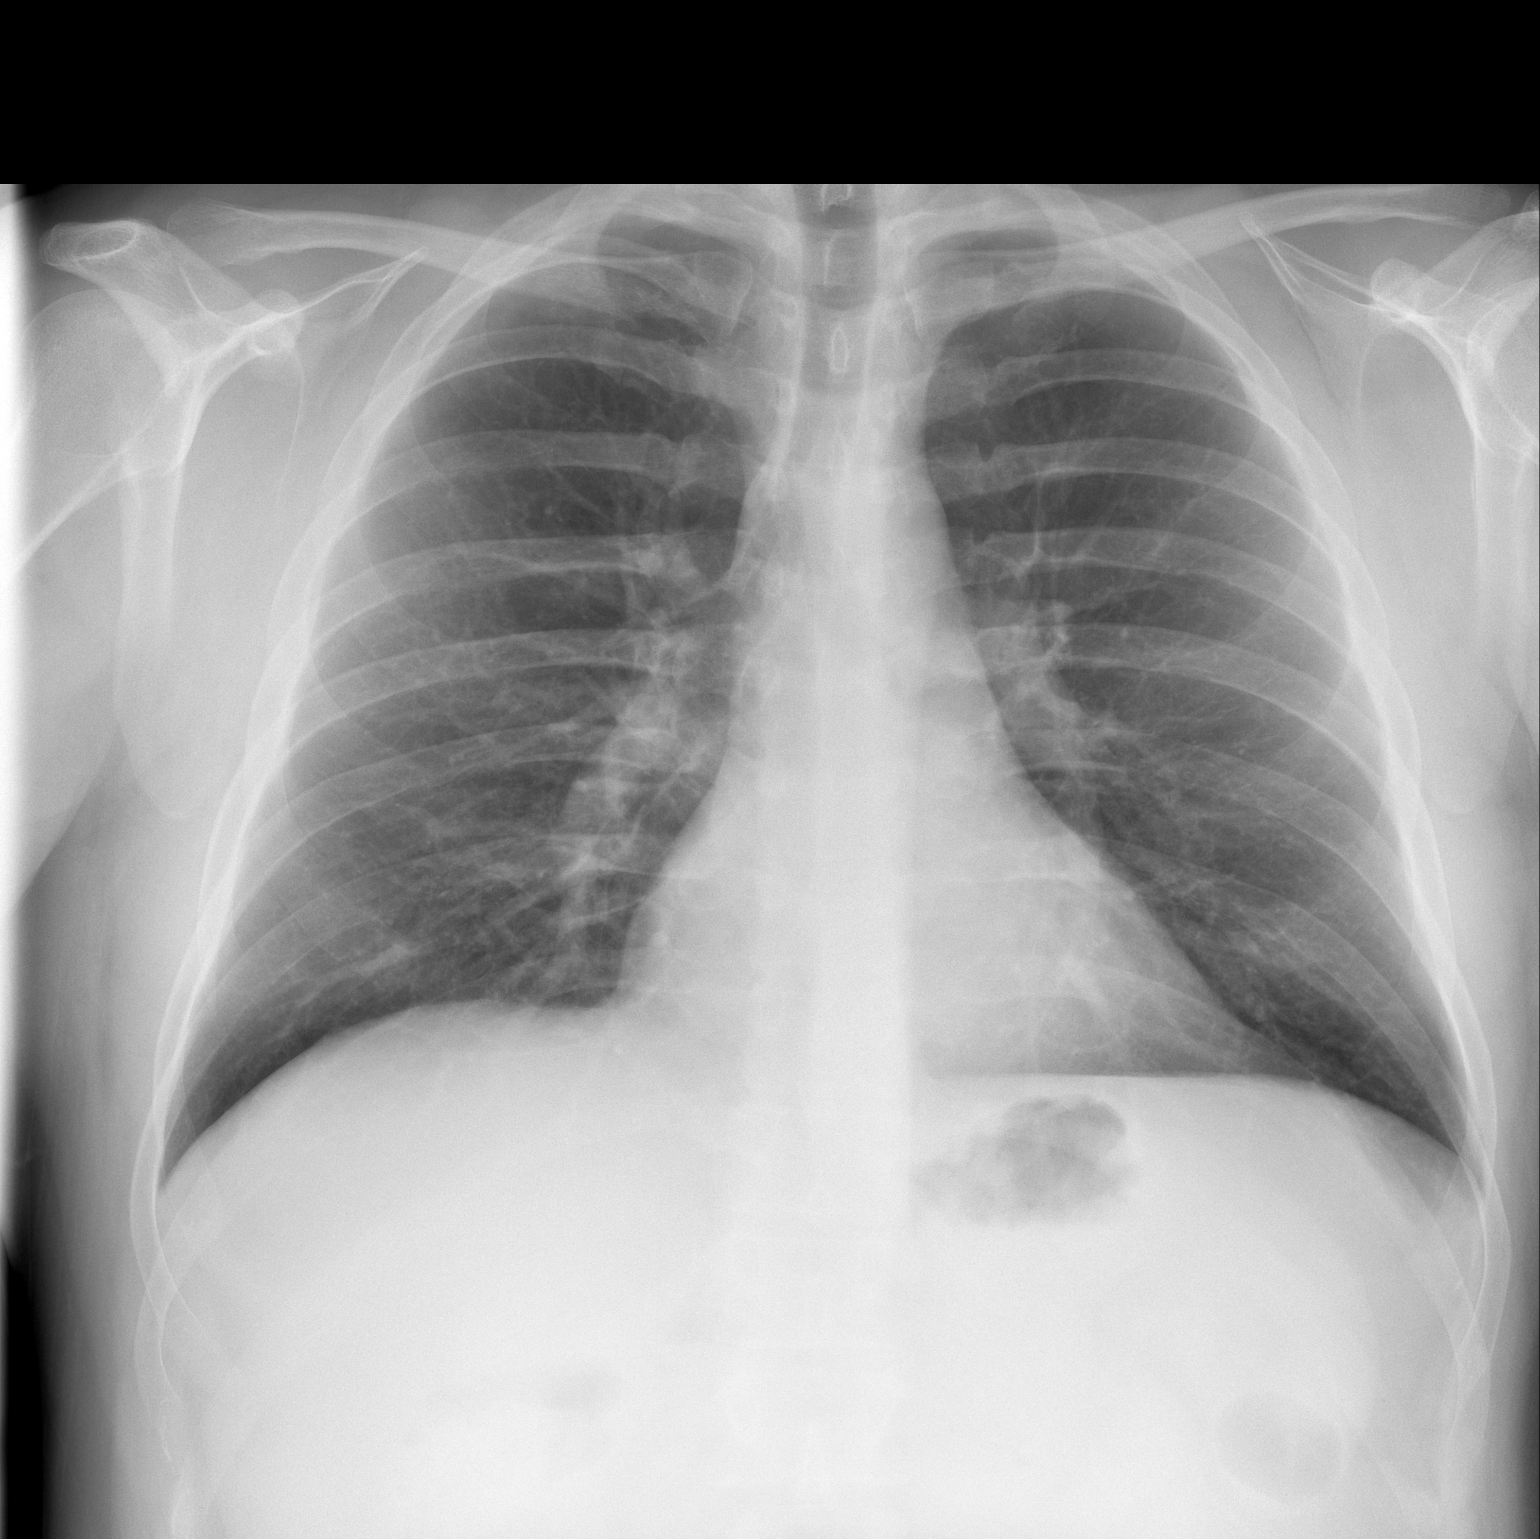

[w chest lat]
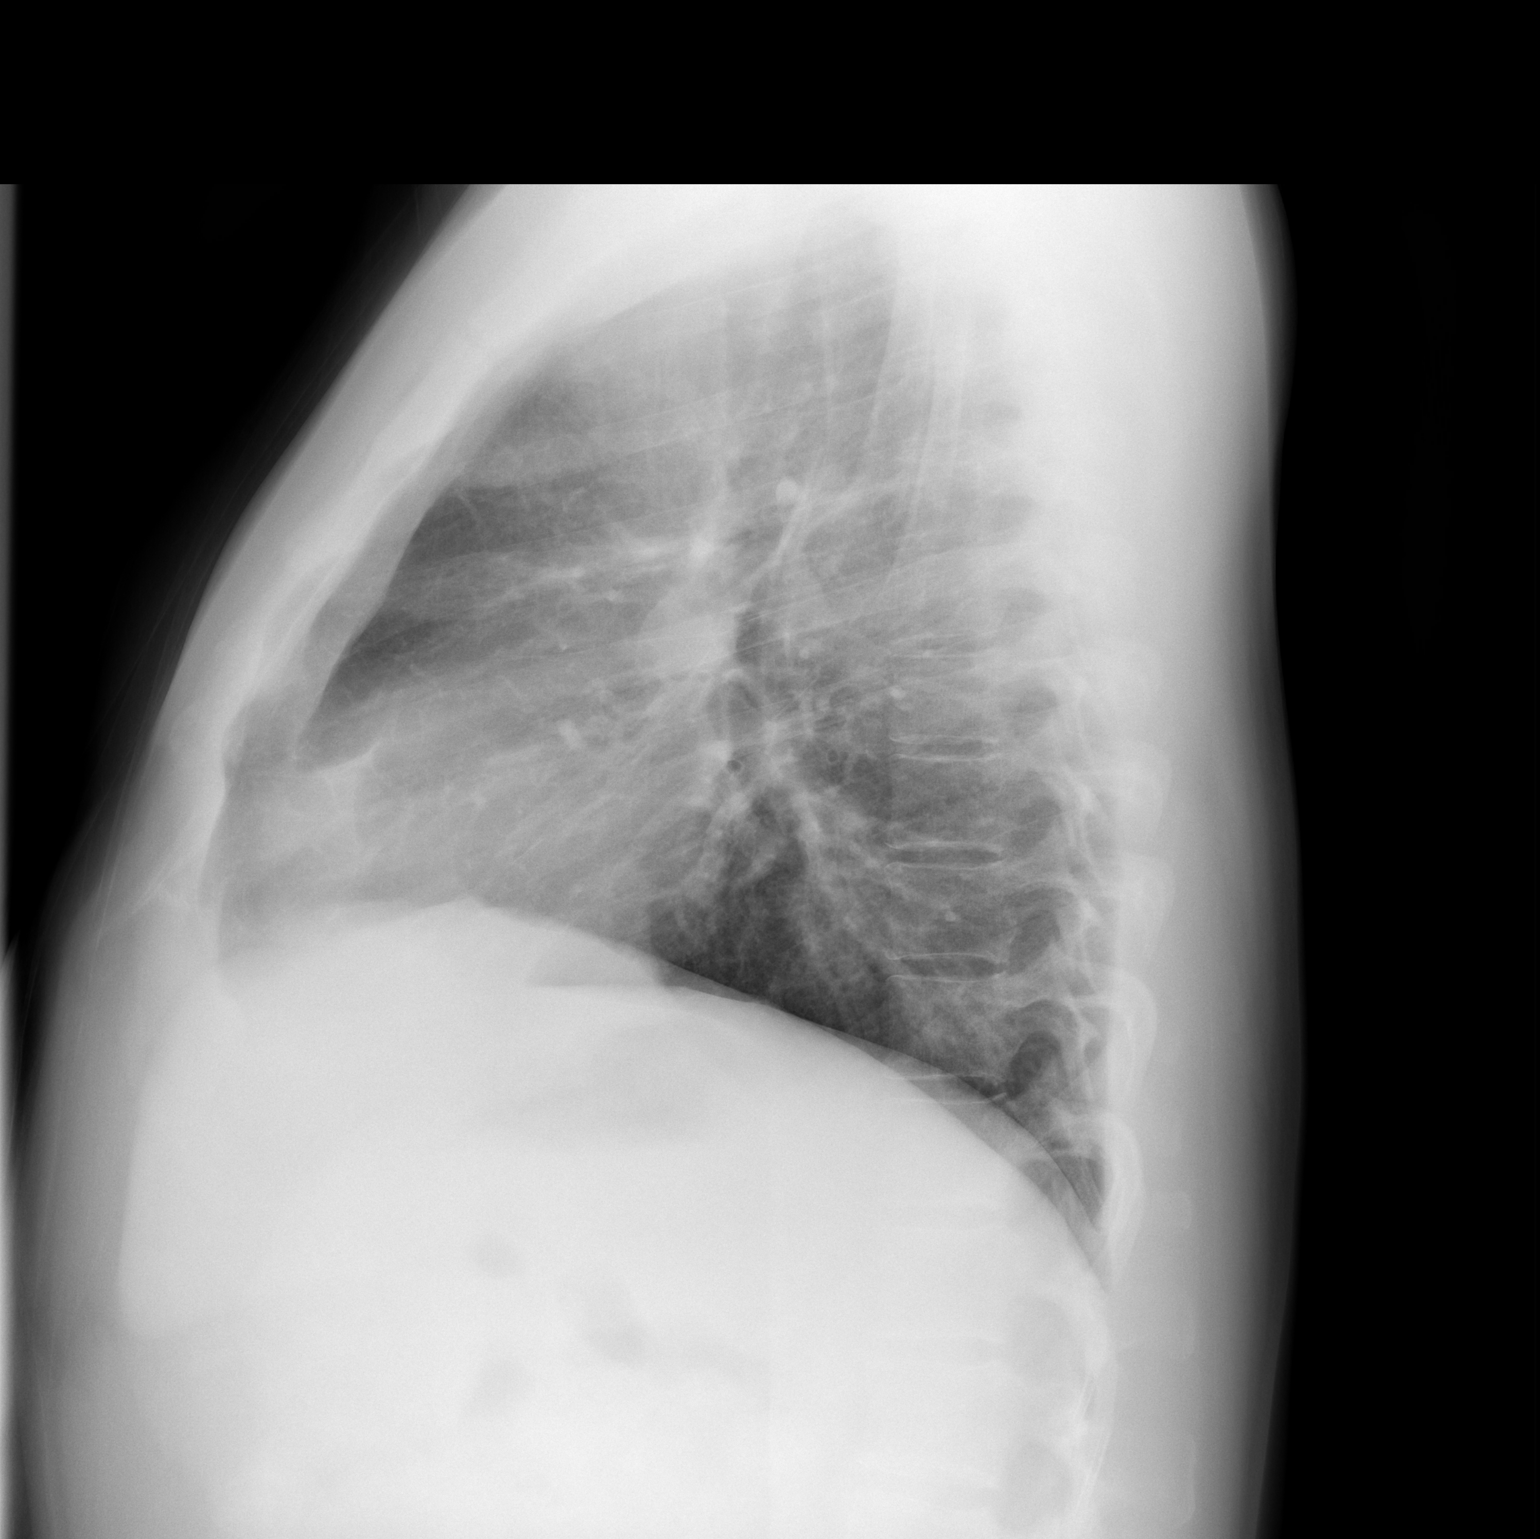

[2 of 2 positions shown; findings below may reference images not displayed]

FINDINGS: Ill-defined opacities projecting over the right lung base on the
frontal projection, corresponding to opacities projecting both over
the heart and over the lower thoracic spine on the lateral view,
concerning for early or mild bronchopneumonia in the right middle
and right lower lobes. Left lung is clear. No evidence of pulmonary
edema. Heart size is normal. Upper mediastinal contours are within
normal limits.
IMPRESSION: 1. Findings are concerning for both the right middle lobe and right
lower lobe bronchopneumonia. Followup PA and lateral chest X-ray is
recommended in 3-4 weeks following trial of antibiotic therapy to
ensure resolution and exclude underlying malignancy.

## 2016-12-04 IMAGING — CR DG CHEST 2V
2 series · 2 of 2 positions shown · non-contrast
Comparison: February 25, 2015

CLINICAL DATA: Recent pneumonia, follow-up today.

EXAM:
CHEST  2 VIEW

[w chest pa]
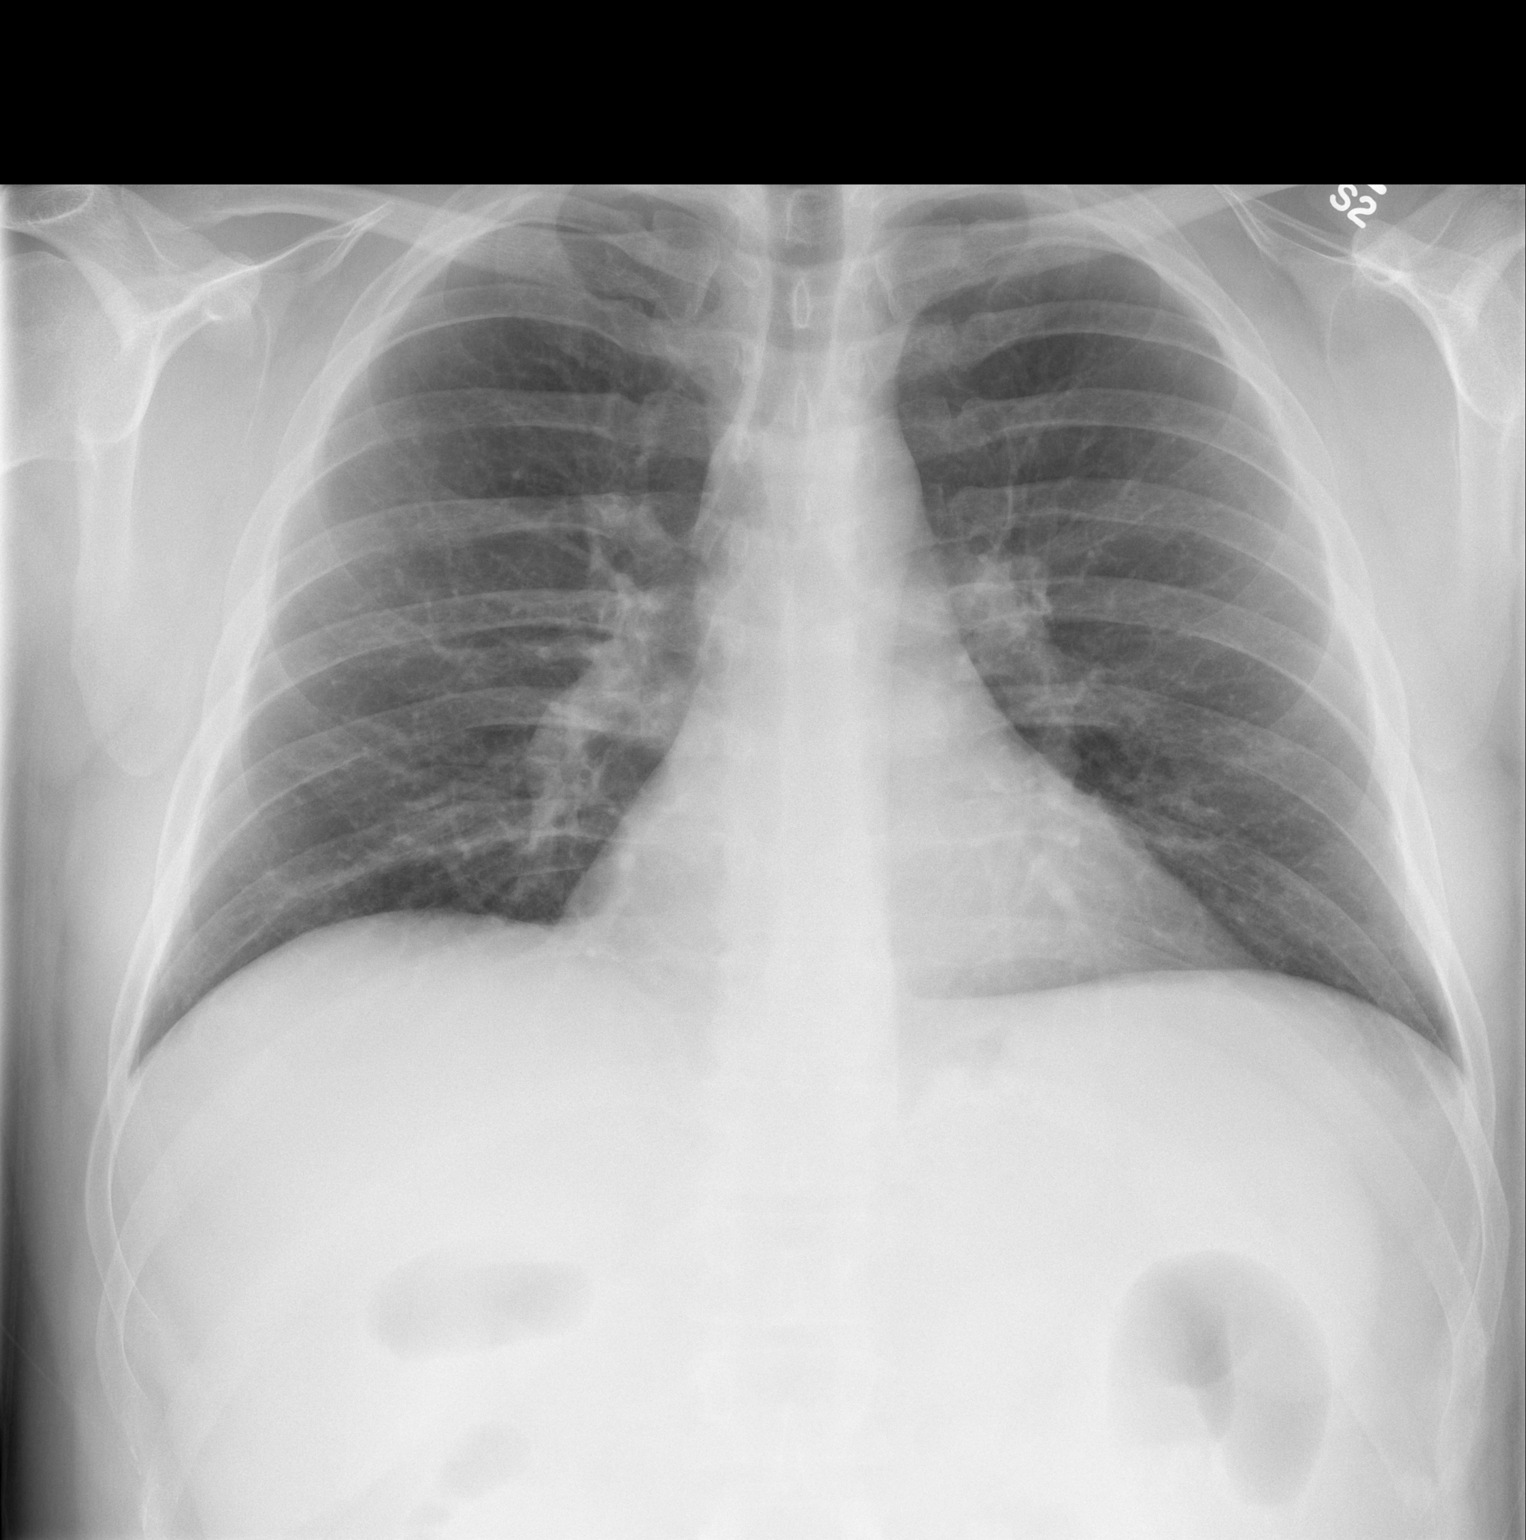

[w chest lat]
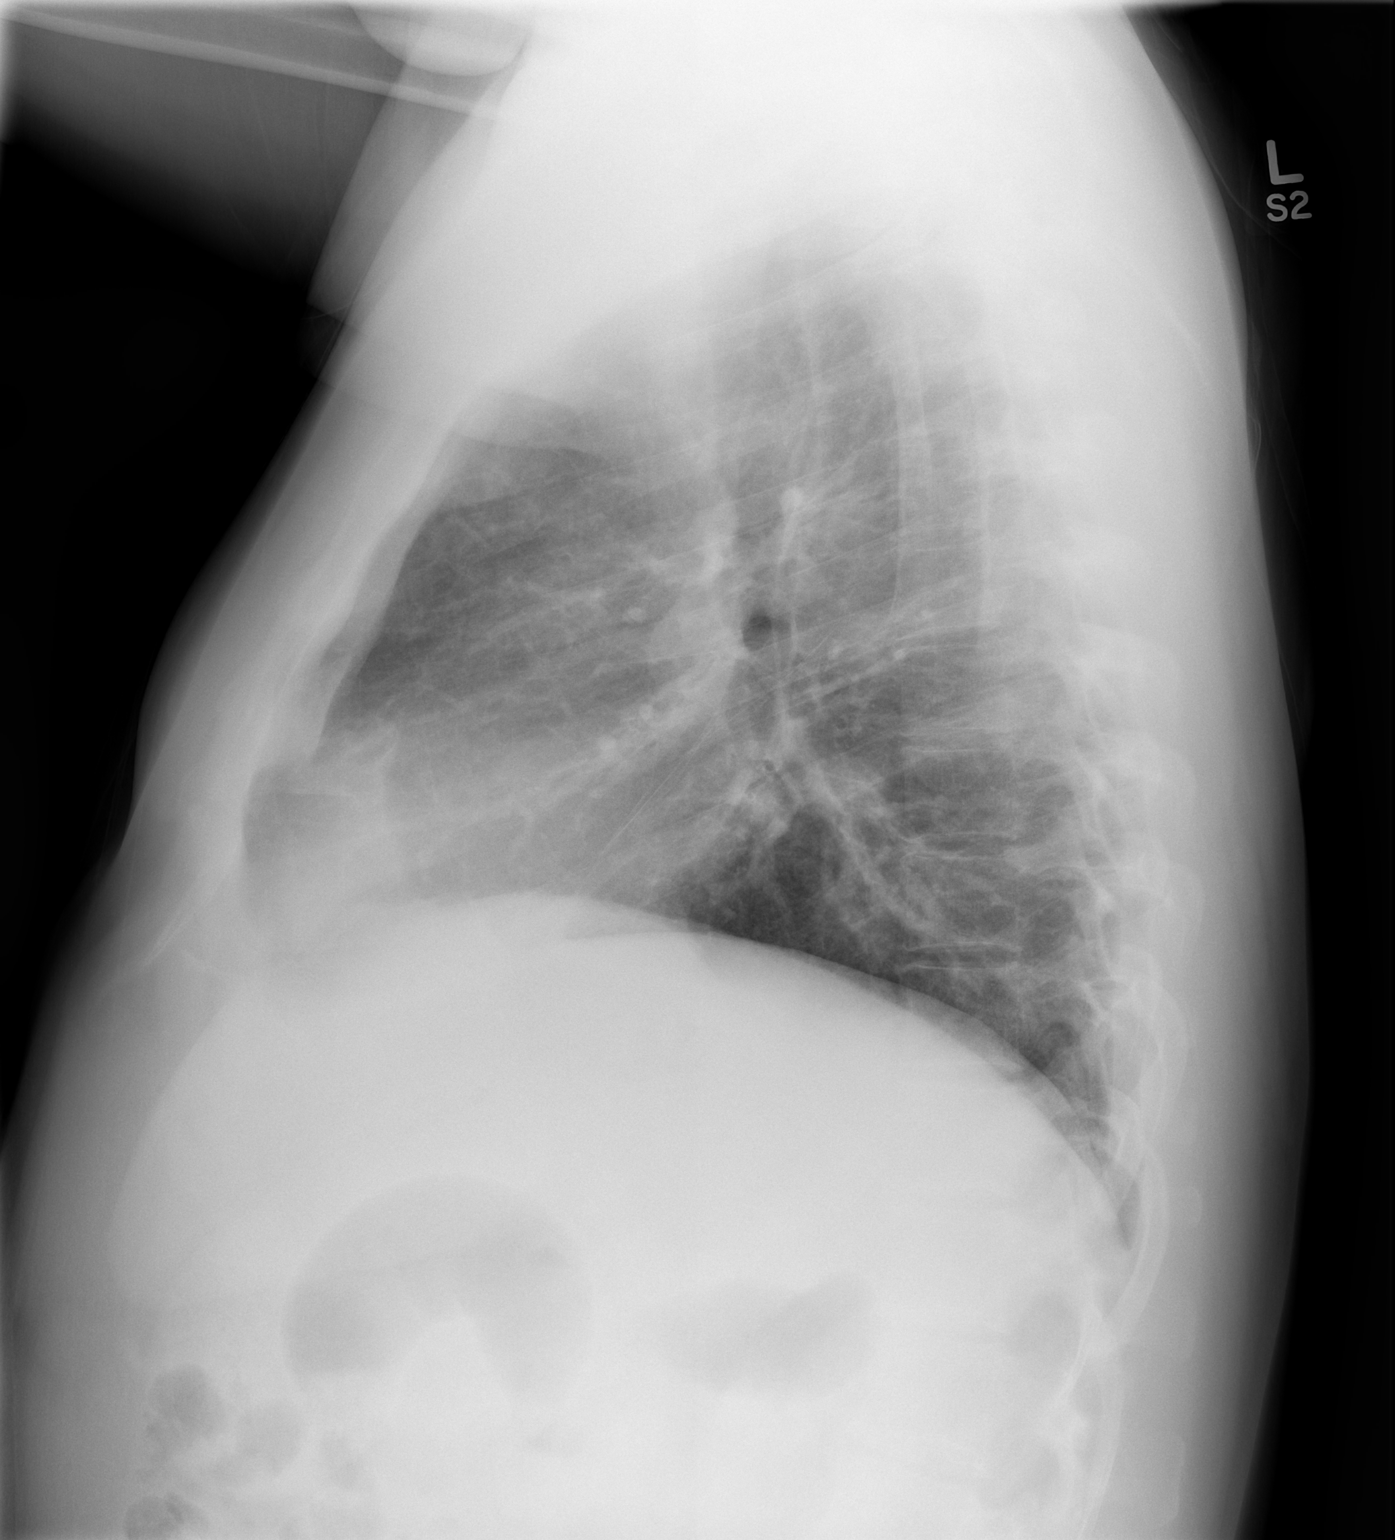

[2 of 2 positions shown; findings below may reference images not displayed]

FINDINGS: The heart size and mediastinal contours are within normal limits.
There is no focal infiltrate, pulmonary edema, or pleural effusion.
The visualized skeletal structures are unremarkable.
IMPRESSION: No active cardiopulmonary disease.

## 2017-02-06 IMAGING — MR MR LUMBAR SPINE W/O CM
5 series · 48 of 48 positions shown · non-contrast
Comparison: None.

CLINICAL DATA: Low back and right leg pain since is strain injury
moving a heavy perioral in July 2014. Initial encounter.

EXAM:
MRI LUMBAR SPINE WITHOUT CONTRAST
TECHNIQUE: Multiplanar, multisequence MR imaging of the lumbar spine was
performed. No intravenous contrast was administered.

[Series 3: T2 · sagittal · 4.0mm · 0.88mm/px · 6 of 12 slices shown (1 of 2)]
[im 1/12]
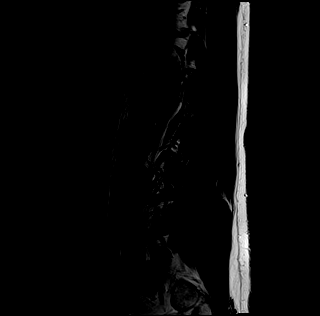
[im 3/12]
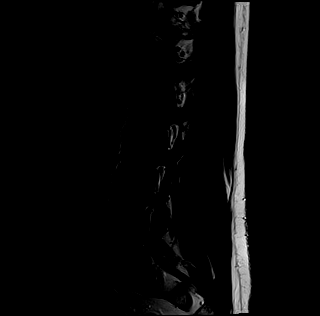
[im 5/12]
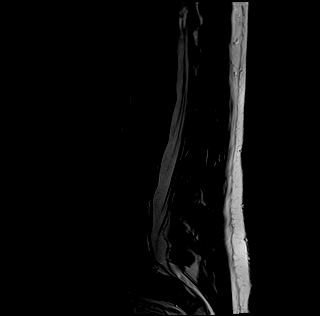
[im 7/12]
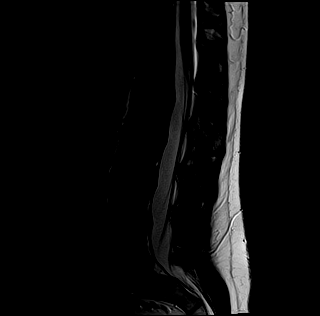
[im 9/12]
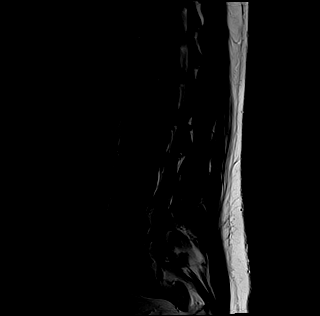
[im 12/12]
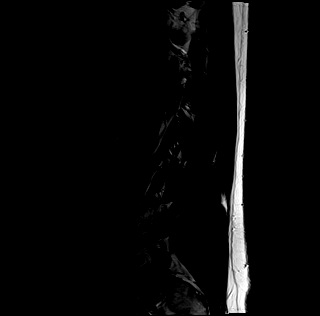

[Series 6: STIR · sagittal · 4.0mm · 0.55mm/px · 5 of 12 slices shown]
[im 1/12]
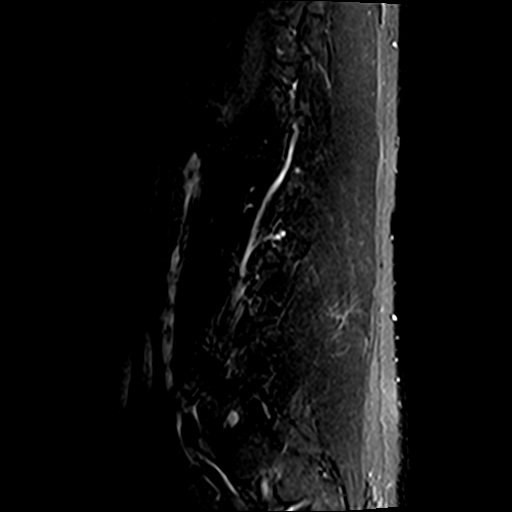
[im 3/12]
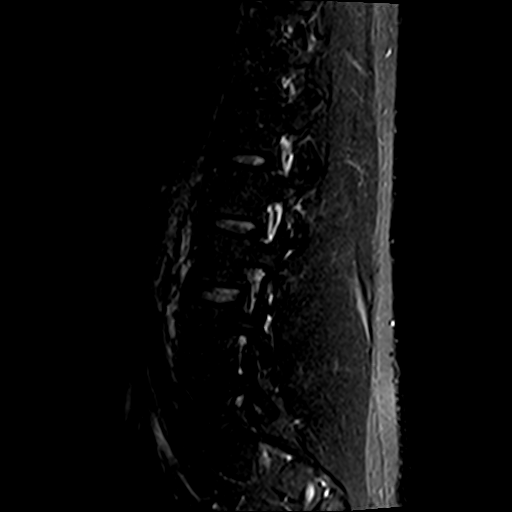
[im 6/12]
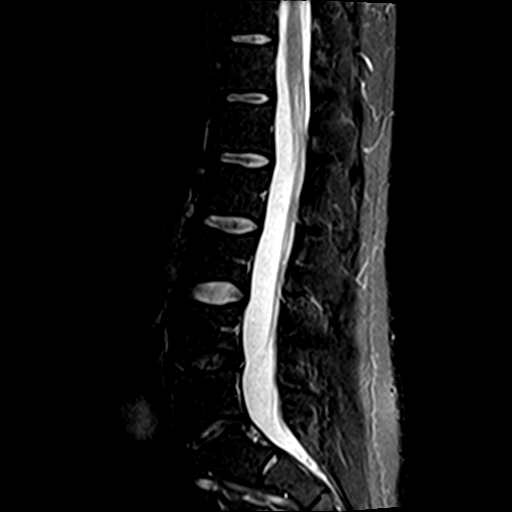
[im 9/12]
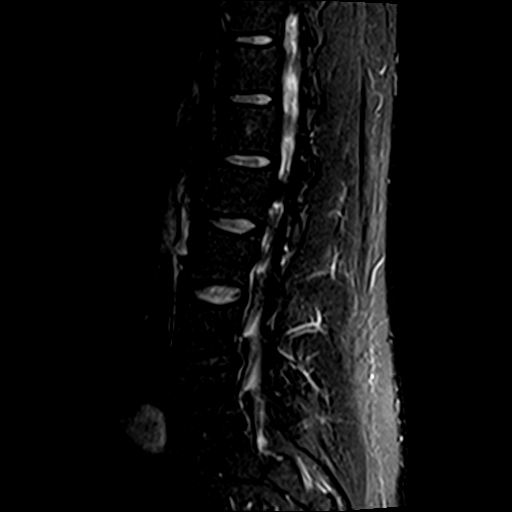
[im 12/12]
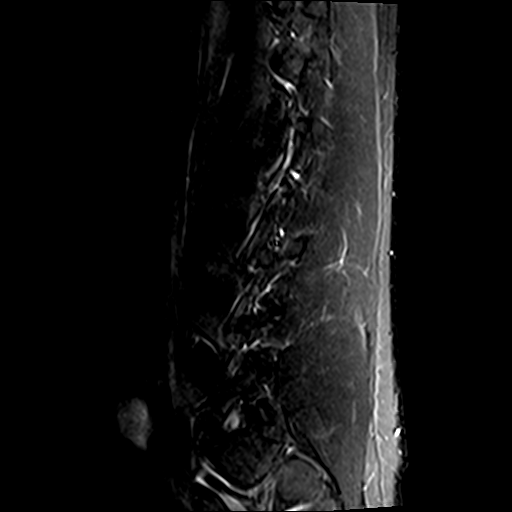

[Series 7: T1 · sagittal · 4.0mm · 0.88mm/px · 5 of 12 slices shown (1 of 2)]
[im 1/12]
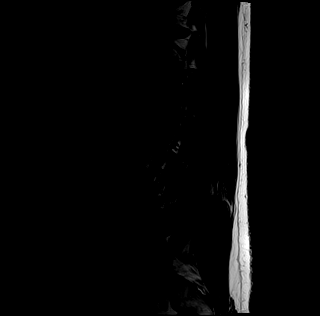
[im 3/12]
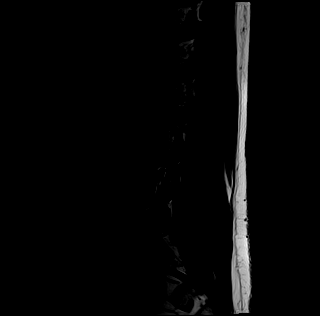
[im 6/12]
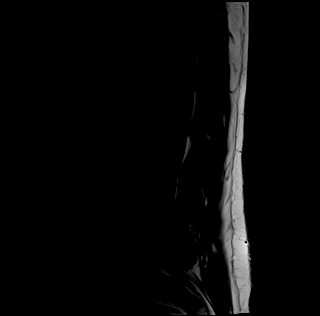
[im 9/12]
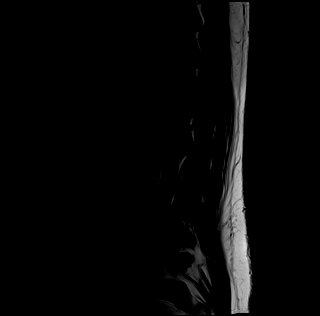
[im 12/12]
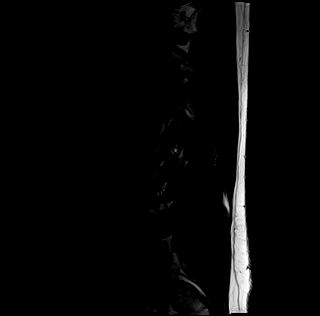

[Series 8: T2 · axial · 4.0mm · 0.70mm/px · z∈[-170,+10]mm · 16 of 35 slices shown (2 of 2)]
[im 1/35]
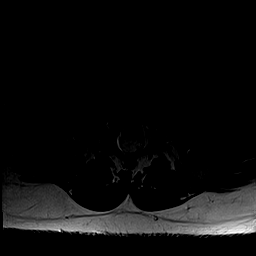
[im 3/35]
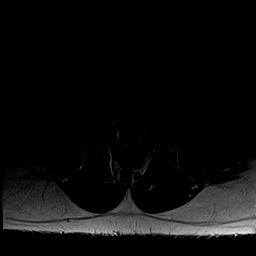
[im 5/35]
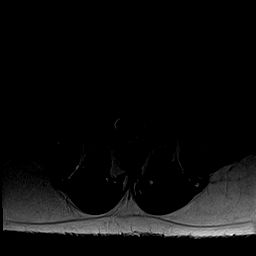
[im 7/35]
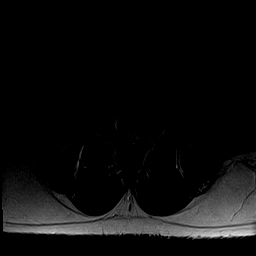
[im 10/35]
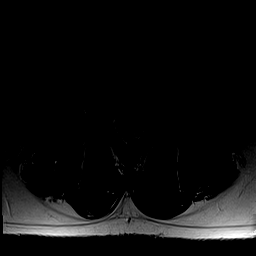
[im 12/35]
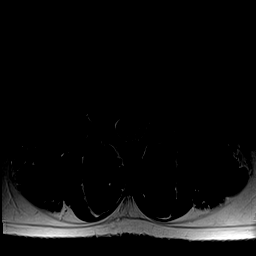
[im 14/35]
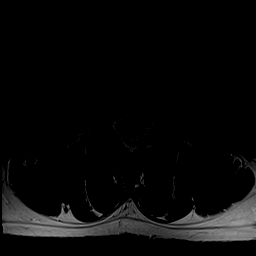
[im 16/35]
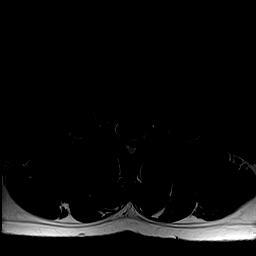
[im 19/35]
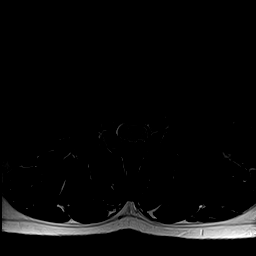
[im 21/35]
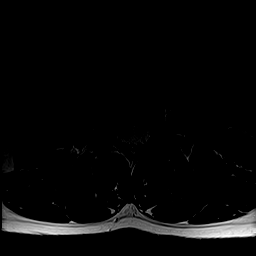
[im 23/35]
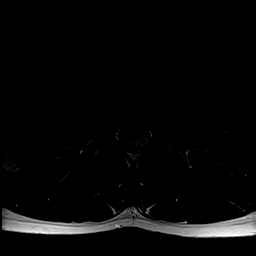
[im 25/35]
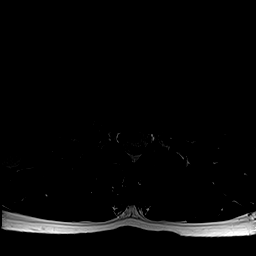
[im 28/35]
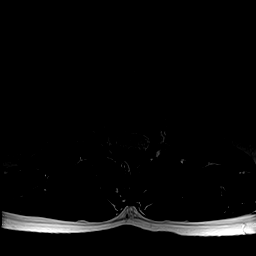
[im 30/35]
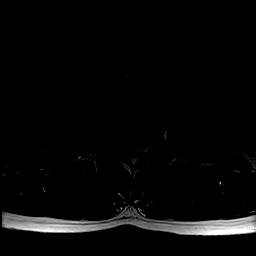
[im 32/35]
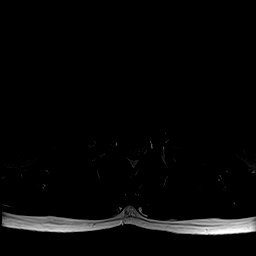
[im 35/35]
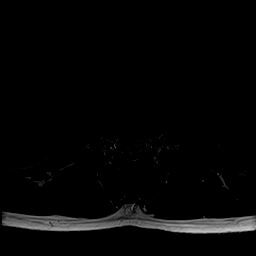

[Series 9: T1 · axial · 4.0mm · 0.70mm/px · z∈[-170,+10]mm · 16 of 35 slices shown (2 of 2)]
[im 1/35]
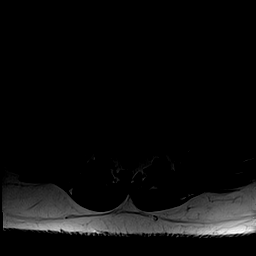
[im 3/35]
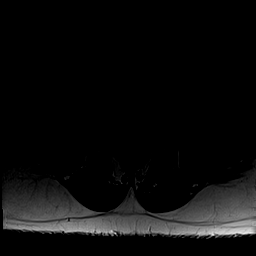
[im 5/35]
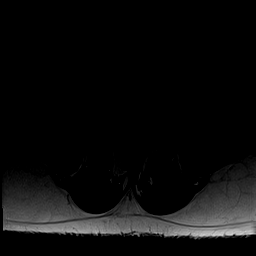
[im 7/35]
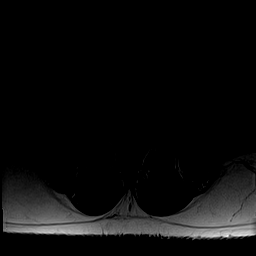
[im 10/35]
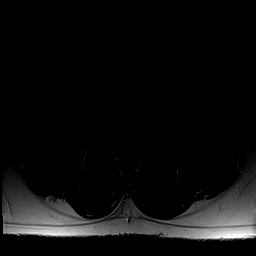
[im 12/35]
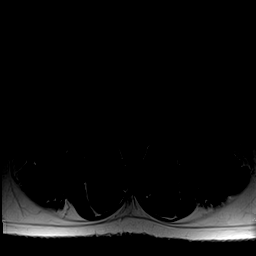
[im 14/35]
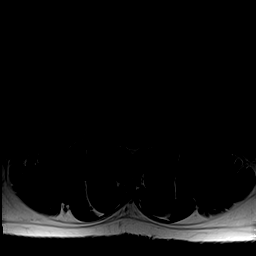
[im 16/35]
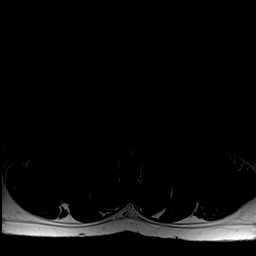
[im 19/35]
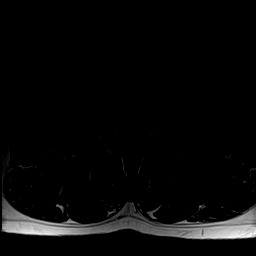
[im 21/35]
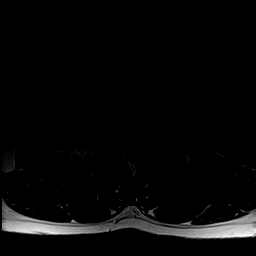
[im 23/35]
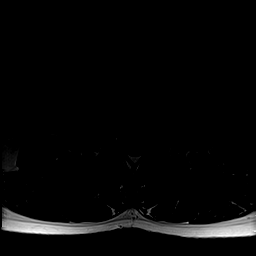
[im 25/35]
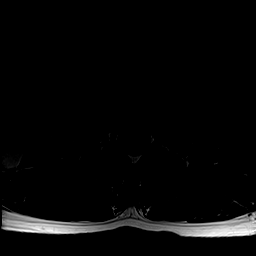
[im 28/35]
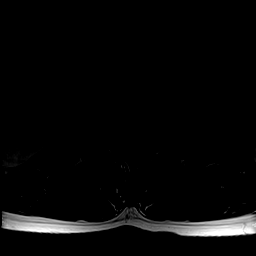
[im 30/35]
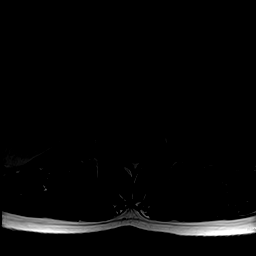
[im 32/35]
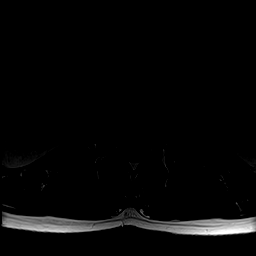
[im 35/35]
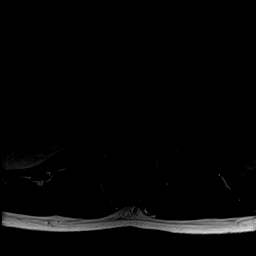

[48 of 48 positions shown; findings below may reference images not displayed]

FINDINGS: Vertebral body height and alignment are maintained. Small hemangioma
is noted in L1. There is no worrisome marrow lesion. The conus
medullaris is normal in signal and position. No pars
interarticularis defect is identified. Imaged intra-abdominal
contents appear normal.

T11-12 and T12-L1 are imaged in the sagittal plane only and
negative.

L1-2:  Negative.

L2-3:  Negative.

L3-4:  Negative.

L4-5: The disc is desiccated with a small right paracentral annular
fissure and minimal bulge. The central canal and foramina are widely
patent.

L5-S1: The disc is desiccated with a minimal bulge. The central
spinal canal and neural foramina are widely patent.
IMPRESSION: Mild degenerative disc disease L4-5 and L5-S1 without central canal
or foraminal narrowing. The examination is otherwise negative.

## 2017-03-17 ENCOUNTER — Other Ambulatory Visit (INDEPENDENT_AMBULATORY_CARE_PROVIDER_SITE_OTHER): Payer: BLUE CROSS/BLUE SHIELD

## 2017-03-17 DIAGNOSIS — Z23 Encounter for immunization: Secondary | ICD-10-CM | POA: Diagnosis not present

## 2017-05-09 ENCOUNTER — Encounter: Payer: Self-pay | Admitting: Medical

## 2017-05-09 ENCOUNTER — Ambulatory Visit: Payer: BLUE CROSS/BLUE SHIELD | Admitting: Medical

## 2017-05-09 VITALS — BP 128/78 | HR 84 | Ht 70.0 in | Wt 210.4 lb

## 2017-05-09 DIAGNOSIS — Z8249 Family history of ischemic heart disease and other diseases of the circulatory system: Secondary | ICD-10-CM | POA: Diagnosis not present

## 2017-05-09 DIAGNOSIS — Z6830 Body mass index (BMI) 30.0-30.9, adult: Secondary | ICD-10-CM

## 2017-05-09 DIAGNOSIS — Z0000 Encounter for general adult medical examination without abnormal findings: Secondary | ICD-10-CM

## 2017-05-09 DIAGNOSIS — E669 Obesity, unspecified: Secondary | ICD-10-CM

## 2017-05-09 DIAGNOSIS — R1310 Dysphagia, unspecified: Secondary | ICD-10-CM | POA: Diagnosis not present

## 2017-05-09 DIAGNOSIS — R454 Irritability and anger: Secondary | ICD-10-CM

## 2017-05-09 DIAGNOSIS — F419 Anxiety disorder, unspecified: Secondary | ICD-10-CM

## 2017-05-09 DIAGNOSIS — K2100 Gastro-esophageal reflux disease with esophagitis, without bleeding: Secondary | ICD-10-CM

## 2017-05-09 DIAGNOSIS — E66811 Obesity, class 1: Secondary | ICD-10-CM

## 2017-05-09 NOTE — Patient Instructions (Addendum)
Thank you for giving me the opportunity to serve you today.    Your diagnosis today includes: Encounter Diagnoses  Name Primary?  . Encounter for health maintenance examination in adult Yes  . Family history of premature CAD   . Class 1 obesity with serious comorbidity and body mass index (BMI) of 30.0 to 30.9 in adult, unspecified obesity type   . Dysphagia, unspecified type   . Gastroesophageal reflux disease with esophagitis   . Anxiety   . Anger     Recommendations:  Eat a healthy low fat diet  Exercise most days per week, at least 30 minutes or more  See your eye doctor yearly for routine vision care.  See your dentist yearly for routine dental care including hygiene visits twice yearly.  I recommend a yearly Influenza/Flu vaccine, typically in September to help reduce the risk of you and others getting the flu illness  We will call with lab results  See me yearly for a physical  I recommend you make an appointment with a counselor    I would like to invite you to my church, [employer] if you are not attending church  Contact info:  [place] 9:30am and 11:30am on Sunday  [address], [place], [place] [address]  [email or web address]   RESOURCES in [place], [place]  If you are experiencing a mental health crisis or an emergency, please call 911 or go to the nearest emergency department.  [facility]   [phone] [facility]  [phone] [facility]   [phone]  Suicide Hotline 1-800-Suicide 910-330-7326)  National Suicide Prevention Lifeline 817-011-1643  629-066-8679)  Domestic Violence, Rape/Crisis - [place] (434) 221-9713  The [employer] Violence Hotline 1-800-799-SAFE (530) 320-0375)  To report Child or Elder Abuse, please call: [facility] Police Department  [phone] [facility] Department  [place]  [phone]  Teen Crisis line [phone] or (503)371-8247     Psychiatry and Counseling services  [facility] [place], [place], [place] [address] [phone]  Lina Sayre, therapist Dr. Lynder Parents, psychiatrist Dr. Milana Huntsman, child psychiatrist   Dr. Launa Flight [address], [place], [place] [address] [phone]   Dr. Chucky May, psychiatry [address] [place], [place] [address] [phone]   [place] [place], [place], [place] [address] [phone]   [facility] [place], [place], [place] [address] [phone]    Counseling Services (NON- psychiatrist offices)  [facility] [address], [place], [place] [address] [phone]   [facility] [phone] [place], [place], [place] [address]   [facility] [phone]  [email or web address] [address] [place], [place], [place] [address]   Merrianne M. Clarene Reamer, therapist [phone] [address] [place], [place] [address]   [facility] [phone] [address] [place], [place] [address]     Gastroesophageal Reflux Disease, Adult Gastroesophageal reflux disease (GERD) happens when acid from your stomach flows up into the esophagus. When acid comes in contact with the esophagus, the acid causes soreness (inflammation) in the esophagus. Over time, GERD may create small holes (ulcers) in the lining of the esophagus. CAUSES   Increased body weight. This puts pressure on the stomach, making acid rise from the stomach into the esophagus.   Smoking. This increases acid production in the stomach.   Drinking alcohol. This causes  decreased pressure in the lower esophageal sphincter (valve or ring of muscle between the esophagus and stomach), allowing acid from the stomach into the  esophagus.   Late evening meals and a full stomach. This increases pressure and acid production in the stomach.   A malformed lower esophageal sphincter.  Sometimes, no cause is found. SYMPTOMS   Burning pain in the lower part of the mid-chest behind the breastbone and in the mid-stomach area. This may occur twice a week or more often.   Trouble swallowing.   Sore throat.   Dry cough.   Asthma-like symptoms including chest tightness, shortness of breath, or wheezing.  DIAGNOSIS  Your caregiver may be able to diagnose GERD based on your symptoms. In some cases, X-rays and other tests may be done to check for complications or to check the condition of your stomach and esophagus. TREATMENT  Your caregiver may recommend over-the-counter or prescription medicines to help decrease acid production. Ask your caregiver before starting or adding any new medicines.  HOME CARE INSTRUCTIONS   Change the factors that you can control. Ask your caregiver for guidance concerning weight loss, quitting smoking, and alcohol consumption.   Avoid foods and drinks that make your symptoms worse, such as:   Caffeine or alcoholic drinks.   Chocolate.   Peppermint or mint flavorings.   Garlic and onions.   Spicy foods.   Citrus fruits, such as oranges, lemons, or limes.   Tomato-based foods such as sauce, chili, salsa, and pizza.   Fried and fatty foods.   Avoid lying down for the 3 hours prior to your bedtime or prior to taking a nap.   Eat small, frequent meals instead of large meals.   Wear loose-fitting clothing. Do not wear anything tight around your waist that causes pressure on your stomach.   Raise the head of your bed 6 to 8 inches with wood blocks to help you sleep. Extra pillows will not help.   Only take over-the-counter or prescription medicines for pain, discomfort, or fever as directed by your caregiver.   Do not take aspirin, ibuprofen, or other nonsteroidal  anti-inflammatory drugs (NSAIDs).  SEEK IMMEDIATE MEDICAL CARE IF:   You have pain in your arms, neck, jaw, teeth, or back.   Your pain increases or changes in intensity or duration.   You develop nausea, vomiting, or sweating (diaphoresis).   You develop shortness of breath, or you faint.   Your vomit is green, yellow, black, or looks like coffee grounds or blood.   Your stool is red, bloody, or black.  These symptoms could be signs of other problems, such as heart disease, gastric bleeding, or esophageal bleeding. MAKE SURE YOU:   Understand these instructions.   Will watch your condition.   Will get help right away if you are not doing well or get worse.  Document Released: 11/24/2004 Document Revised: 10/27/2010 Document Reviewed: 09/03/2010 [facility] Patient Information 2012 [place].  Please note: If you take medications for blood pressure, diabetes, cholesterol or other routine medications, we typically need to see you on a separate visit at least yearly for that concern.   For some conditions like diabetes, we will need to see you more often for medication management

## 2017-05-09 NOTE — Progress Notes (Signed)
Subjective:   HPI  Justin Phillips is a 37 y.o. male who presents for a complete physical.  Concerns: Still gets some trouble swallowing, feels like air bubble stuck in throat.  No prior GI consult.  Still gets GERD problems some times.  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   Mom had atherosclerosis found this past year, has to see thoracic surgeon today about lung mass, she already has COPD, on oxygen.   He reports dealing with a lot of anxiety.  Over analyzes things, causes anger as well, in general.  Been dealing with this for years.  No prior counselor.  Parents went through nasty divorce when he was 89yo.   He saw a friend get shot when he was age 89yo.  Sometimes feels down or depressed.  No thoughts of SI/HI.   Relationship with wife and daughter is good.  Not attending church.    Currently drinking 18 pack per week.    Reviewed their medical, surgical, family, social, medication, and allergy history and updated chart as appropriate.  Past Medical History:  Diagnosis Date  . Alcohol use   . Chronic headaches 03/2016  . Family history of premature CAD    mother  . GERD (gastroesophageal reflux disease) 01/2015  . Obesity     Past Surgical History:  Procedure Laterality Date  . ANKLE ARTHROSCOPY WITH ARTHRODESIS  1997   left; age 42    Social History   Socioeconomic History  . Marital status: Married    Spouse name: Not on file  . Number of children: Not on file  . Years of education: Not on file  . Highest education level: Not on file  Social Needs  . Financial resource strain: Not on file  . Food insecurity - worry: Not on file  . Food insecurity - inability: Not on file  . Transportation needs - medical: Not on file  . Transportation needs - non-medical: Not on file  Occupational History  . Not on file  Tobacco Use  . Smoking status: Former Smoker    Packs/day: 1.00    Years: 13.00    Pack years: 13.00    Last attempt to quit:  02/25/2008    Years since quitting: 9.2  . Smokeless tobacco: Never Used  Substance and Sexual Activity  . Alcohol use: Yes    Alcohol/week: 12.6 oz    Types: 21 Cans of beer per week  . Drug use: No  . Sexual activity: Not on file  Other Topics Concern  . Not on file  Social History Narrative   Married, has 33yo daughter.   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  Exercise - some walking, works outside, walks the dog, has puppy.   As of 04/2017    Family History  Problem Relation Age of Onset  . Heart disease Mother 24       MI  . Other Mother        perforated colon  . Asthma Mother   . Stroke Father 23  . Other Father        pain medication addition  . Hypertension Father   . Diabetes Sister        borderline  . Hyperthyroidism Sister   . Asthma Sister   . Cancer Maternal Grandmother        lung  . Asthma Maternal Grandfather   . Cancer Paternal Grandfather        lung  Current Outpatient Medications:  .  acetaminophen (TYLENOL) 325 MG tablet, Take 650 mg by mouth every 6 (six) hours as needed., Disp: , Rfl:  .  Multiple Vitamin (MULTIVITAMIN) tablet, Take 1 tablet by mouth daily., Disp: , Rfl:   Allergies  Allergen Reactions  . Advil [Ibuprofen]     Hives   . Nsaids Hives    Review of Systems Constitutional: -fever, -chills, -sweats, -unexpected weight change, -decreased appetite, -fatigue Allergy: -sneezing, -itching, -congestion Dermatology: -changing moles, --rash, -lumps ENT: -runny nose, -ear pain, -sore throat, -hoarseness, -sinus pain, -teeth pain, - ringing in ears, -hearing loss, -nosebleeds Cardiology: -chest pain, -palpitations, -swelling, -difficulty breathing when lying flat, -waking up short of breath Respiratory: -cough, -shortness of breath, -difficulty breathing with exercise or exertion, -wheezing, -coughing up blood Gastroenterology: -abdominal pain, -nausea, -vomiting, -diarrhea, -constipation, -blood in stool, -changes  in bowel movement, +difficulty swallowing or eating Hematology: -bleeding, -bruising  Musculoskeletal: -joint aches, -muscle aches, -joint swelling, -back pain, -neck pain, -cramping, -changes in gait Ophthalmology: denies vision changes, eye redness, itching, discharge Urology: -burning with urination, -difficulty urinating, -blood in urine, -urinary frequency, -urgency, -incontinence Neurology: -headache, -weakness, -tingling, -numbness, -memory loss, -falls, -dizziness Psychology: +depressed mood, -agitation, -sleep problems     Objective:   Physical Exam  BP 128/78   Pulse 84   Ht 5\' 10"  (1.778 m)   Wt 210 lb 6.4 oz (95.4 kg)   SpO2 97%   BMI 30.19 kg/m   General appearance: alert, no distress, WD/WN, white male Skin: scattered freckles, no other worrisome lesion, right scalp within hair line with 88mm mobile cystic lesion HEENT: normocephalic, conjunctiva/corneas normal, sclerae anicteric, PERRLA, EOMi, nares patent, no discharge or erythema, pharynx normal Oral cavity: MMM, tongue normal, teeth in good repair, there is a pink 28mm x 52mm raised fleshy growth in right posterior buccal mucosa unchanged for years per patient Neck: supple, no lymphadenopathy, no thyromegaly, no masses, normal ROM, no bruits Chest: non tender, normal shape and expansion Heart: RRR, normal S1, S2, no murmurs Lungs: CTA bilaterally, no wheezes, rhonchi, or rales Abdomen: +bs, soft, non tender, non distended, no masses, no hepatomegaly, no splenomegaly, no bruits Back: non tender, normal ROM, no scoliosis Musculoskeletal: upper extremities non tender, no obvious deformity, normal ROM throughout, lower extremities non tender, no obvious deformity, normal ROM throughout Extremities: no edema, no cyanosis, no clubbing Pulses: 2+ symmetric, upper and lower extremities, normal cap refill Neurological: alert, oriented x 3, CN2-12 intact, strength normal upper extremities and lower extremities, sensation normal  throughout, DTRs 2+ throughout, no cerebellar signs, gait normal Psychiatric: normal affect, behavior normal, pleasant  GU: normal male external genitalia, circumcised, nontender, no masses, no hernia, no lymphadenopathy Rectal: deferred   Assessment and Plan :     Encounter Diagnoses  Name Primary?  . Encounter for health maintenance examination in adult Yes  . Family history of premature CAD   . Class 1 obesity with serious comorbidity and body mass index (BMI) of 30.0 to 30.9 in adult, unspecified obesity type   . Dysphagia, unspecified type   . Gastroesophageal reflux disease with esophagitis   . Anxiety   . Anger     Physical exam - discussed healthy lifestyle, diet, exercise, preventative care, vaccinations, and addressed their concerns.    Recommendations:  Eat a healthy low fat diet  Exercise most days per week, at least 30 minutes or more  See your eye doctor yearly for routine vision care.  See your dentist yearly for routine  dental care including hygiene visits twice yearly.  I recommend a yearly Influenza/Flu vaccine, typically in September to help reduce the risk of you and others getting the flu illness  We will call with lab results  See me yearly for a physical  I recommend you make an appointment with a counselor  Counseled on reducing alcohol intake, counseled on establishing with counselor for PTSD, anger, and anxiety.   Avoid GERD triggers, and if not much improvement in the next month, consider GI consult  Follow-up pending labs  Zeshan was seen today for physical.  Diagnoses and all orders for this visit:  Encounter for health maintenance examination in adult -     Comprehensive metabolic panel -     CBC with Differential/Platelet -     Hemoglobin A1c -     TSH -     Lipid panel  Family history of premature CAD  Class 1 obesity with serious comorbidity and body mass index (BMI) of 30.0 to 30.9 in adult, unspecified obesity  type  Dysphagia, unspecified type  Gastroesophageal reflux disease with esophagitis  Anxiety  Anger

## 2017-05-10 LAB — TSH: TSH: 1.24 u[IU]/mL (ref 0.450–4.500)

## 2017-05-10 LAB — COMPREHENSIVE METABOLIC PANEL
ALBUMIN: 4.4 g/dL (ref 3.5–5.5)
ALK PHOS: 92 IU/L (ref 39–117)
ALT: 58 IU/L — ABNORMAL HIGH (ref 0–44)
AST: 34 IU/L (ref 0–40)
Albumin/Globulin Ratio: 1.7 (ref 1.2–2.2)
BILIRUBIN TOTAL: 0.3 mg/dL (ref 0.0–1.2)
BUN / CREAT RATIO: 17 (ref 9–20)
BUN: 14 mg/dL (ref 6–20)
CHLORIDE: 104 mmol/L (ref 96–106)
CO2: 21 mmol/L (ref 20–29)
Calcium: 9.5 mg/dL (ref 8.7–10.2)
Creatinine, Ser: 0.84 mg/dL (ref 0.76–1.27)
GFR calc Af Amer: 130 mL/min/{1.73_m2} (ref >59)
GFR calc non Af Amer: 113 mL/min/{1.73_m2} (ref >59)
GLUCOSE: 91 mg/dL (ref 65–99)
Globulin, Total: 2.6 g/dL (ref 1.5–4.5)
POTASSIUM: 4.6 mmol/L (ref 3.5–5.2)
SODIUM: 143 mmol/L (ref 134–144)
Total Protein: 7 g/dL (ref 6.0–8.5)

## 2017-05-10 LAB — LIPID PANEL
CHOL/HDL RATIO: 3.3 ratio (ref 0.0–5.0)
Cholesterol, Total: 202 mg/dL — ABNORMAL HIGH (ref 100–199)
HDL: 61 mg/dL (ref >39)
LDL CALC: 124 mg/dL — AB (ref 0–99)
Triglycerides: 86 mg/dL (ref 0–149)
VLDL Cholesterol Cal: 17 mg/dL (ref 5–40)

## 2017-05-10 LAB — CBC WITH DIFFERENTIAL/PLATELET
BASOS ABS: 0 10*3/uL (ref 0.0–0.2)
Basos: 0 %
EOS (ABSOLUTE): 0.1 10*3/uL (ref 0.0–0.4)
Eos: 1 %
Hematocrit: 45.2 % (ref 37.5–51.0)
Hemoglobin: 15.2 g/dL (ref 13.0–17.7)
Immature Grans (Abs): 0 10*3/uL (ref 0.0–0.1)
Immature Granulocytes: 0 %
LYMPHS ABS: 3.4 10*3/uL — AB (ref 0.7–3.1)
Lymphs: 40 %
MCH: 29.7 pg (ref 26.6–33.0)
MCHC: 33.6 g/dL (ref 31.5–35.7)
MCV: 89 fL (ref 79–97)
MONOCYTES: 5 %
Monocytes Absolute: 0.4 10*3/uL (ref 0.1–0.9)
Neutrophils Absolute: 4.5 10*3/uL (ref 1.4–7.0)
Neutrophils: 54 %
PLATELETS: 239 10*3/uL (ref 150–379)
RBC: 5.11 x10E6/uL (ref 4.14–5.80)
RDW: 14.2 % (ref 12.3–15.4)
WBC: 8.4 10*3/uL (ref 3.4–10.8)

## 2017-05-10 LAB — HEMOGLOBIN A1C
ESTIMATED AVERAGE GLUCOSE: 114 mg/dL
Hgb A1c MFr Bld: 5.6 % (ref 4.8–5.6)

## 2017-05-11 ENCOUNTER — Other Ambulatory Visit: Payer: Self-pay | Admitting: Medical

## 2017-05-11 DIAGNOSIS — R945 Abnormal results of liver function studies: Principal | ICD-10-CM

## 2017-05-11 DIAGNOSIS — R7989 Other specified abnormal findings of blood chemistry: Secondary | ICD-10-CM

## 2017-08-08 ENCOUNTER — Ambulatory Visit: Payer: BLUE CROSS/BLUE SHIELD | Admitting: Medical

## 2017-08-08 ENCOUNTER — Encounter: Payer: Self-pay | Admitting: Medical

## 2017-08-08 VITALS — BP 136/94 | HR 74 | Temp 98.1°F | Ht 71.0 in | Wt 213.8 lb

## 2017-08-08 DIAGNOSIS — R1310 Dysphagia, unspecified: Secondary | ICD-10-CM

## 2017-08-08 DIAGNOSIS — R110 Nausea: Secondary | ICD-10-CM | POA: Insufficient documentation

## 2017-08-08 DIAGNOSIS — R748 Abnormal levels of other serum enzymes: Secondary | ICD-10-CM | POA: Insufficient documentation

## 2017-08-08 DIAGNOSIS — R109 Unspecified abdominal pain: Secondary | ICD-10-CM | POA: Diagnosis not present

## 2017-08-08 LAB — POCT UA - MICROSCOPIC ONLY

## 2017-08-08 LAB — POCT URINALYSIS DIP (PROADVANTAGE DEVICE)
BILIRUBIN UA: NEGATIVE
Glucose, UA: NEGATIVE mg/dL
Ketones, POC UA: NEGATIVE mg/dL
LEUKOCYTES UA: NEGATIVE
Nitrite, UA: NEGATIVE
Protein Ur, POC: NEGATIVE mg/dL
pH, UA: 7 (ref 5.0–8.0)

## 2017-08-08 NOTE — Progress Notes (Signed)
Subjective: Chief Complaint  Patient presents with  . Abdominal Cramping    right flank pain several weeks non consistent    Here for right abdominal pain.   Been having throbbing pain for weeks.  More of a flank pain that is gradually gotten worse and more frequent.  Nothing sets it off particularly.  He does have ongoing problems with trouble swallowing, sometimes it feels like food gets stuck sometimes vomits food back up.  Has occasional loose stool.  No constipation.  No urinary changes.  No stool color changes.   may have seen blood on the toilet paper 2 weeks ago but not normally seeing blood in the stool. No fever.  No history of gallbladder disease or kidney stone.  He is drinking 3-4 beers per night.  Diet is not the best.  No history of pancreatitis.  No other aggravating or relieving factors. No other complaint.  Past Medical History:  Diagnosis Date  . Alcohol use   . Chronic headaches 03/2016  . Family history of premature CAD    mother  . GERD (gastroesophageal reflux disease) 01/2015  . Obesity    Past Surgical History:  Procedure Laterality Date  . ANKLE ARTHROSCOPY WITH ARTHRODESIS  1997   left; age 32    No current outpatient medications on file prior to visit.   No current facility-administered medications on file prior to visit.    ROS as in subjective  Objective BP (!) 136/94   Pulse 74   Temp 98.1 F (36.7 C) (Oral)   Ht 5\' 11"  (1.803 m)   Wt 213 lb 12.8 oz (97 kg)   SpO2 97%   BMI 29.82 kg/m   Wt Readings from Last 3 Encounters:  08/08/17 213 lb 12.8 oz (97 kg)  05/09/17 210 lb 6.4 oz (95.4 kg)  05/17/16 213 lb (96.6 kg)   General appearance: alert, no distress, WD/WN,  Heart: RRR, normal S1, S2, no murmurs Lungs: CTA bilaterally, no wheezes, rhonchi, or rales Abdomen: +bs, soft, mid right abdominal tenderness, otherwise non tender, non distended, no masses, no hepatomegaly, no splenomegaly No back tenderness No LE tenderness Pulses: 2+  symmetric, upper and lower extremities, normal cap refill     Assessment: Encounter Diagnoses  Name Primary?  . Abdominal pain, right lateral Yes  . Nausea   . Dysphagia, unspecified type   . Elevated liver enzymes      Plan: We discussed his symptoms.  He has had ongoing problems with swallowing so we will refer to gastroenterology.  We will recheck on elevated liver test from last visit.  His current pain could suggest renal stone, gallbladder or other but is not clear.  Pending labs may consider imaging.  For now, hydrate well, can use OTC analgesic prn, avoid spicy or fried foods.   F/u pending labs  Breon was seen today for abdominal cramping.  Diagnoses and all orders for this visit:  Abdominal pain, right lateral -     CBC with Differential/Platelet -     Comprehensive metabolic panel -     Ambulatory referral to Gastroenterology  Nausea -     CBC with Differential/Platelet -     Comprehensive metabolic panel -     Ambulatory referral to Gastroenterology  Dysphagia, unspecified type -     CBC with Differential/Platelet -     Comprehensive metabolic panel -     Ambulatory referral to Gastroenterology  Elevated liver enzymes

## 2017-08-08 NOTE — Addendum Note (Signed)
Addended by: Edgar Frisk on: 08/08/2017 03:39 PM   Modules accepted: Orders

## 2017-08-09 ENCOUNTER — Other Ambulatory Visit: Payer: Self-pay | Admitting: Medical

## 2017-08-09 ENCOUNTER — Encounter: Payer: Self-pay | Admitting: Gastroenterology

## 2017-08-09 DIAGNOSIS — R748 Abnormal levels of other serum enzymes: Secondary | ICD-10-CM

## 2017-08-09 DIAGNOSIS — R109 Unspecified abdominal pain: Secondary | ICD-10-CM

## 2017-08-09 LAB — CBC WITH DIFFERENTIAL/PLATELET
BASOS ABS: 0 10*3/uL (ref 0.0–0.2)
BASOS: 0 %
EOS (ABSOLUTE): 0.2 10*3/uL (ref 0.0–0.4)
Eos: 2 %
HEMATOCRIT: 43.8 % (ref 37.5–51.0)
HEMOGLOBIN: 14.9 g/dL (ref 13.0–17.7)
Immature Grans (Abs): 0 10*3/uL (ref 0.0–0.1)
Immature Granulocytes: 0 %
LYMPHS ABS: 3.4 10*3/uL — AB (ref 0.7–3.1)
LYMPHS: 36 %
MCH: 29.9 pg (ref 26.6–33.0)
MCHC: 34 g/dL (ref 31.5–35.7)
MCV: 88 fL (ref 79–97)
MONOS ABS: 0.7 10*3/uL (ref 0.1–0.9)
Monocytes: 7 %
NEUTROS ABS: 5.1 10*3/uL (ref 1.4–7.0)
Neutrophils: 55 %
Platelets: 248 10*3/uL (ref 150–450)
RBC: 4.99 x10E6/uL (ref 4.14–5.80)
RDW: 14.9 % (ref 12.3–15.4)
WBC: 9.5 10*3/uL (ref 3.4–10.8)

## 2017-08-09 LAB — COMPREHENSIVE METABOLIC PANEL
ALK PHOS: 89 IU/L (ref 39–117)
ALT: 82 IU/L — ABNORMAL HIGH (ref 0–44)
AST: 40 IU/L (ref 0–40)
Albumin/Globulin Ratio: 1.8 (ref 1.2–2.2)
Albumin: 4.5 g/dL (ref 3.5–5.5)
BILIRUBIN TOTAL: 0.3 mg/dL (ref 0.0–1.2)
BUN / CREAT RATIO: 17 (ref 9–20)
BUN: 14 mg/dL (ref 6–20)
CHLORIDE: 104 mmol/L (ref 96–106)
CO2: 23 mmol/L (ref 20–29)
Calcium: 9.6 mg/dL (ref 8.7–10.2)
Creatinine, Ser: 0.84 mg/dL (ref 0.76–1.27)
GFR calc non Af Amer: 112 mL/min/{1.73_m2} (ref >59)
GFR, EST AFRICAN AMERICAN: 129 mL/min/{1.73_m2} (ref >59)
GLOBULIN, TOTAL: 2.5 g/dL (ref 1.5–4.5)
Glucose: 74 mg/dL (ref 65–99)
Potassium: 4.3 mmol/L (ref 3.5–5.2)
SODIUM: 141 mmol/L (ref 134–144)
TOTAL PROTEIN: 7 g/dL (ref 6.0–8.5)

## 2017-08-11 ENCOUNTER — Other Ambulatory Visit: Payer: Self-pay

## 2017-08-12 LAB — HEPATITIS PANEL, ACUTE
HEP A IGM: NEGATIVE
HEP B S AG: NEGATIVE
Hep B C IgM: NEGATIVE

## 2017-08-12 LAB — SPECIMEN STATUS REPORT

## 2017-08-22 ENCOUNTER — Ambulatory Visit
Admission: RE | Admit: 2017-08-22 | Discharge: 2017-08-22 | Disposition: A | Discharge status: Home / Self Care | Payer: Self-pay | Source: Ambulatory Visit | Attending: Medical | Admitting: Medical

## 2017-08-22 DIAGNOSIS — R748 Abnormal levels of other serum enzymes: Secondary | ICD-10-CM

## 2017-08-22 DIAGNOSIS — R109 Unspecified abdominal pain: Secondary | ICD-10-CM

## 2017-08-24 ENCOUNTER — Telehealth: Payer: Self-pay

## 2017-08-24 ENCOUNTER — Other Ambulatory Visit: Payer: Self-pay | Admitting: Medical

## 2017-08-24 MED ORDER — OMEPRAZOLE 40 MG PO CPDR: 40.0000 mg | DELAYED_RELEASE_CAPSULE | Freq: Every day | ORAL | 0 refills | Status: DC

## 2017-08-24 NOTE — Telephone Encounter (Signed)
Left message on voicemail for patient to call back for updated appointment to GI to Thursday September 07, 2017 at 9:00 at [facility].

## 2017-09-07 ENCOUNTER — Ambulatory Visit: Payer: BLUE CROSS/BLUE SHIELD | Admitting: Gastroenterology

## 2017-09-07 ENCOUNTER — Encounter: Payer: Self-pay | Admitting: Gastroenterology

## 2017-09-07 VITALS — BP 118/80 | HR 74 | Ht 71.0 in | Wt 212.5 lb

## 2017-09-07 DIAGNOSIS — K219 Gastro-esophageal reflux disease without esophagitis: Secondary | ICD-10-CM | POA: Diagnosis not present

## 2017-09-07 DIAGNOSIS — R1310 Dysphagia, unspecified: Secondary | ICD-10-CM

## 2017-09-07 MED ORDER — OMEPRAZOLE 40 MG PO CPDR: 40.0000 mg | DELAYED_RELEASE_CAPSULE | Freq: Every day | ORAL | 5 refills | Status: DC

## 2017-09-07 NOTE — Progress Notes (Signed)
Agree with assessment and plan as outlined.  

## 2017-09-07 NOTE — Patient Instructions (Signed)
We have sent the following medications to your pharmacy for you to pick up at your convenience:  Omeprazole  You have been scheduled for an endoscopy. Please follow written instructions given to you at your visit today. If you use inhalers (even only as needed), please bring them with you on the day of your procedure. Your physician has requested that you go to www.startemmi.com and enter the access code given to you at your visit today. This web site gives a general overview about your procedure. However, you should still follow specific instructions given to you by our office regarding your preparation for the procedure.   

## 2017-09-07 NOTE — Progress Notes (Signed)
09/07/2017 Justin Phillips [id number] 13-Sep-1980   HISTORY OF PRESENT ILLNESS: This is a 37 year old male who is new to our practice.  He has been referred here by Chana Bode, PA-C, for evaluation regarding abdominal pain, reflux, and problems swallowing.  The patient tells me that he has experienced issues with severe reflux, particularly at night with burning coming up in his nose and causing coughing for several years.  He then tells me also for the past year or so he has been having problems with eating where he would feel like food would get stuck and sometimes he have to have vomit the contents in order to relieve the situation.  He says that it was occurring mostly with meats and sandwiches.  He was started on omeprazole 40 mg daily about 2 weeks ago and he says that since starting that he has not had any issues with swallowing.  The reflux has been significantly better as well.  He tells me that he was a big alcohol drinker, drinking about 5-8 beers per day for the past 12 years.  He stopped drinking alcohol 4 days ago.  He admits to feeling a little bit anxious and having trouble sleeping, but no significant tremors, etc.  He says that he was having a little bit of right upper quadrant abdominal pain for the past couple of months as well.  Ultrasound showed hepatic steatosis.  General labs are unremarkable except for a mildly elevated ALT of 82.  Remaining LFTs are normal.  Viral hepatitis studies are negative.   Past Medical History:  Diagnosis Date  . Alcohol use   . Chronic headaches 03/2016  . Family history of premature CAD    mother  . GERD (gastroesophageal reflux disease) 01/2015  . Obesity    Past Surgical History:  Procedure Laterality Date  . ANKLE ARTHROSCOPY WITH ARTHRODESIS  1997   left; age 37    reports that he quit smoking about 9 years ago. He has a 13.00 pack-year smoking history. He has never used smokeless tobacco. He reports that he drinks about 12.6  oz of alcohol per week. He reports that he does not use drugs. family history includes Asthma in his maternal grandfather, mother, and sister; Cancer in his maternal grandmother and paternal grandfather; Diabetes in his sister; Heart disease (age of onset: 62) in his mother; Hypertension in his father; Hyperthyroidism in his sister; Other in his father and mother; Stroke (age of onset: 23) in his father. Allergies  Allergen Reactions  . Advil [Ibuprofen]     Hives   . Nsaids Hives      Outpatient Encounter Medications as of 09/07/2017  Medication Sig  . omeprazole (PRILOSEC) 40 MG capsule Take 1 capsule (40 mg total) by mouth daily.   No facility-administered encounter medications on file as of 09/07/2017.      REVIEW OF SYSTEMS  : All other systems reviewed and negative except where noted in the History of Present Illness.   PHYSICAL EXAM: BP 118/80   Pulse 74   Ht 5\' 11"  (1.803 m)   Wt 212 lb 8 oz (96.4 kg)   BMI 29.64 kg/m   General: Well developed white male in no acute distress Head: Normocephalic and atraumatic Eyes:  Sclerae anicteric, conjunctiva pink. Ears: Normal auditory acuity Lungs: Clear throughout to auscultation; no increased WOB. Heart: Regular rate and rhythm; no M/R/G. Abdomen: Soft, non-distended.  BS present.  Non-tender. Musculoskeletal: Symmetrical with no gross deformities  Skin:  No lesions on visible extremities Extremities: No edema  Neurological: Alert oriented x 4, grossly non-focal Psychological:  Alert and cooperative. Normal mood and affect  ASSESSMENT AND PLAN: *Dysphagia and GERD:  Long-standing severe GERD, but now much improved with PPI x 2 weeks.  Dysphagia also improved/resolved so was likely reflux induced.  Due to long-standing symptoms, however, will schedule for EGD with Dr. Havery Moros.  Otherwise will continue omeprazole 40 mg daily for now, prescription with refills sent to pharmacy. *Mildly elevated ALT with steatosis on  ultrasound:  Was a very heavy drinker, just stopped drinking 4 days ago.  Encouragement given to remain off of ETOH.  Needs good diet, exercise.  Viral hepatitis panel negative.  **The risks, benefits, and alternatives to EGD were discussed with the patient and he consents to proceed.   CC:  Tysinger, Camelia Eng, PA-C

## 2017-09-14 ENCOUNTER — Encounter: Payer: Self-pay | Admitting: Gastroenterology

## 2017-10-04 ENCOUNTER — Ambulatory Visit: Payer: Self-pay | Admitting: Gastroenterology

## 2018-03-10 ENCOUNTER — Other Ambulatory Visit: Payer: Self-pay | Admitting: Gastroenterology

## 2018-05-12 ENCOUNTER — Other Ambulatory Visit: Payer: Self-pay | Admitting: Gastroenterology

## 2018-10-22 ENCOUNTER — Other Ambulatory Visit: Payer: Self-pay | Admitting: Gastroenterology

## 2019-03-11 ENCOUNTER — Other Ambulatory Visit: Payer: Self-pay | Admitting: Gastroenterology

## 2019-06-25 ENCOUNTER — Other Ambulatory Visit: Payer: Self-pay | Admitting: Gastroenterology

## 2021-11-03 ENCOUNTER — Encounter: Payer: Self-pay | Admitting: Internal Medicine

## 2021-12-07 ENCOUNTER — Encounter: Payer: Self-pay | Admitting: Internal Medicine
